# Patient Record
Sex: Male | Born: 1960 | Hispanic: No | Marital: Married | Smoking: Former smoker
Health system: Southern US, Community
[De-identification: ages and names within clinical notes are randomized; demographics above are authoritative.]

## PROBLEM LIST (undated history)

## (undated) DIAGNOSIS — E785 Hyperlipidemia, unspecified: Secondary | ICD-10-CM

## (undated) DIAGNOSIS — F329 Major depressive disorder, single episode, unspecified: Secondary | ICD-10-CM

## (undated) DIAGNOSIS — I10 Essential (primary) hypertension: Secondary | ICD-10-CM

## (undated) DIAGNOSIS — F32A Depression, unspecified: Secondary | ICD-10-CM

## (undated) HISTORY — DX: Hyperlipidemia, unspecified: E78.5

## (undated) HISTORY — DX: Essential (primary) hypertension: I10

## (undated) HISTORY — PX: CORONARY ARTERY BYPASS GRAFT: SHX141

## (undated) HISTORY — PX: SPINE SURGERY: SHX786

## (undated) HISTORY — DX: Major depressive disorder, single episode, unspecified: F32.9

## (undated) HISTORY — PX: TONSILECTOMY, ADENOIDECTOMY, BILATERAL MYRINGOTOMY AND TUBES: SHX2538

## (undated) HISTORY — DX: Depression, unspecified: F32.A

---

## 2007-08-31 ENCOUNTER — Encounter: Admission: RE | Admit: 2007-08-31 | Discharge: 2007-10-10 | Payer: Self-pay | Admitting: Anesthesiology

## 2007-09-05 ENCOUNTER — Ambulatory Visit: Payer: Self-pay | Admitting: Anesthesiology

## 2007-10-04 ENCOUNTER — Encounter
Admission: RE | Admit: 2007-10-04 | Discharge: 2007-10-05 | Payer: Self-pay | Admitting: Physical Medicine & Rehabilitation

## 2007-10-05 ENCOUNTER — Ambulatory Visit: Payer: Self-pay | Admitting: Physical Medicine & Rehabilitation

## 2007-10-10 ENCOUNTER — Ambulatory Visit: Payer: Self-pay | Admitting: Anesthesiology

## 2007-11-30 ENCOUNTER — Encounter: Admission: RE | Admit: 2007-11-30 | Discharge: 2008-02-28 | Payer: Self-pay | Admitting: Anesthesiology

## 2007-12-05 ENCOUNTER — Ambulatory Visit: Payer: Self-pay | Admitting: Anesthesiology

## 2008-01-16 ENCOUNTER — Ambulatory Visit: Payer: Self-pay | Admitting: Anesthesiology

## 2008-03-11 ENCOUNTER — Encounter: Admission: RE | Admit: 2008-03-11 | Discharge: 2008-06-09 | Payer: Self-pay | Admitting: Anesthesiology

## 2008-03-12 ENCOUNTER — Ambulatory Visit: Payer: Self-pay | Admitting: Anesthesiology

## 2008-04-09 ENCOUNTER — Ambulatory Visit: Payer: Self-pay | Admitting: Anesthesiology

## 2008-06-04 ENCOUNTER — Encounter: Admission: RE | Admit: 2008-06-04 | Discharge: 2008-07-31 | Payer: Self-pay | Admitting: Anesthesiology

## 2008-06-04 ENCOUNTER — Ambulatory Visit: Payer: Self-pay | Admitting: Anesthesiology

## 2008-07-31 ENCOUNTER — Encounter
Admission: RE | Admit: 2008-07-31 | Discharge: 2008-10-29 | Payer: Self-pay | Admitting: Physical Medicine and Rehabilitation

## 2008-08-02 ENCOUNTER — Ambulatory Visit: Payer: Self-pay | Admitting: Physical Medicine and Rehabilitation

## 2008-10-09 ENCOUNTER — Ambulatory Visit: Payer: Self-pay | Admitting: Physical Medicine and Rehabilitation

## 2008-11-06 ENCOUNTER — Encounter
Admission: RE | Admit: 2008-11-06 | Discharge: 2008-11-08 | Payer: Self-pay | Admitting: Physical Medicine and Rehabilitation

## 2008-11-08 ENCOUNTER — Ambulatory Visit: Payer: Self-pay | Admitting: Physical Medicine and Rehabilitation

## 2009-03-20 ENCOUNTER — Encounter
Admission: RE | Admit: 2009-03-20 | Discharge: 2009-06-16 | Payer: Self-pay | Admitting: Physical Medicine and Rehabilitation

## 2009-03-21 ENCOUNTER — Ambulatory Visit: Payer: Self-pay | Admitting: Physical Medicine and Rehabilitation

## 2009-04-18 ENCOUNTER — Ambulatory Visit: Payer: Self-pay | Admitting: Physical Medicine and Rehabilitation

## 2009-04-18 ENCOUNTER — Encounter
Admission: RE | Admit: 2009-04-18 | Discharge: 2009-05-22 | Payer: Self-pay | Admitting: Physical Medicine & Rehabilitation

## 2009-04-28 ENCOUNTER — Ambulatory Visit: Payer: Self-pay | Admitting: Physical Medicine & Rehabilitation

## 2009-05-05 ENCOUNTER — Ambulatory Visit: Payer: Self-pay | Admitting: Physical Medicine & Rehabilitation

## 2009-05-12 ENCOUNTER — Ambulatory Visit: Payer: Self-pay | Admitting: Physical Medicine & Rehabilitation

## 2009-05-22 ENCOUNTER — Ambulatory Visit: Payer: Self-pay | Admitting: Physical Medicine & Rehabilitation

## 2009-06-16 ENCOUNTER — Encounter
Admission: RE | Admit: 2009-06-16 | Discharge: 2009-09-10 | Payer: Self-pay | Admitting: Physical Medicine and Rehabilitation

## 2009-07-21 ENCOUNTER — Ambulatory Visit: Payer: Self-pay | Admitting: Physical Medicine and Rehabilitation

## 2009-08-20 ENCOUNTER — Ambulatory Visit: Payer: Self-pay | Admitting: Physical Medicine and Rehabilitation

## 2009-09-10 ENCOUNTER — Encounter
Admission: RE | Admit: 2009-09-10 | Discharge: 2009-12-09 | Payer: Self-pay | Admitting: Physical Medicine and Rehabilitation

## 2009-09-17 ENCOUNTER — Ambulatory Visit: Payer: Self-pay | Admitting: Physical Medicine and Rehabilitation

## 2009-10-03 ENCOUNTER — Ambulatory Visit: Payer: Self-pay | Admitting: Physical Medicine and Rehabilitation

## 2009-11-12 ENCOUNTER — Ambulatory Visit: Payer: Self-pay | Admitting: Physical Medicine and Rehabilitation

## 2009-12-18 ENCOUNTER — Encounter
Admission: RE | Admit: 2009-12-18 | Discharge: 2010-03-18 | Payer: Self-pay | Source: Home / Self Care | Attending: Physical Medicine and Rehabilitation | Admitting: Physical Medicine and Rehabilitation

## 2009-12-24 ENCOUNTER — Ambulatory Visit: Payer: Self-pay | Admitting: Physical Medicine and Rehabilitation

## 2010-01-26 ENCOUNTER — Ambulatory Visit: Payer: Self-pay | Admitting: Physical Medicine and Rehabilitation

## 2010-02-24 ENCOUNTER — Ambulatory Visit: Payer: Self-pay | Admitting: Physical Medicine and Rehabilitation

## 2010-03-30 ENCOUNTER — Encounter
Admission: RE | Admit: 2010-03-30 | Discharge: 2010-04-14 | Payer: Self-pay | Source: Home / Self Care | Attending: Physical Medicine and Rehabilitation | Admitting: Physical Medicine and Rehabilitation

## 2010-04-07 ENCOUNTER — Ambulatory Visit: Admit: 2010-04-07 | Payer: Self-pay | Admitting: Physical Medicine and Rehabilitation

## 2010-04-27 ENCOUNTER — Ambulatory Visit: Payer: Worker's Compensation | Admitting: Physical Medicine and Rehabilitation

## 2010-04-27 ENCOUNTER — Encounter: Payer: Worker's Compensation | Attending: Physical Medicine and Rehabilitation

## 2010-04-27 DIAGNOSIS — M24559 Contracture, unspecified hip: Secondary | ICD-10-CM | POA: Insufficient documentation

## 2010-04-27 DIAGNOSIS — G894 Chronic pain syndrome: Secondary | ICD-10-CM

## 2010-04-27 DIAGNOSIS — Z9689 Presence of other specified functional implants: Secondary | ICD-10-CM | POA: Insufficient documentation

## 2010-04-27 DIAGNOSIS — M48061 Spinal stenosis, lumbar region without neurogenic claudication: Secondary | ICD-10-CM

## 2010-04-27 DIAGNOSIS — G589 Mononeuropathy, unspecified: Secondary | ICD-10-CM | POA: Insufficient documentation

## 2010-04-27 DIAGNOSIS — M545 Low back pain, unspecified: Secondary | ICD-10-CM | POA: Insufficient documentation

## 2010-04-27 DIAGNOSIS — G8929 Other chronic pain: Secondary | ICD-10-CM | POA: Insufficient documentation

## 2010-04-27 DIAGNOSIS — M79609 Pain in unspecified limb: Secondary | ICD-10-CM

## 2010-04-27 DIAGNOSIS — I69992 Facial weakness following unspecified cerebrovascular disease: Secondary | ICD-10-CM | POA: Insufficient documentation

## 2010-06-15 ENCOUNTER — Encounter
Payer: Worker's Compensation | Attending: Physical Medicine and Rehabilitation | Admitting: Physical Medicine and Rehabilitation

## 2010-06-15 DIAGNOSIS — M545 Low back pain, unspecified: Secondary | ICD-10-CM | POA: Insufficient documentation

## 2010-06-15 DIAGNOSIS — I739 Peripheral vascular disease, unspecified: Secondary | ICD-10-CM | POA: Insufficient documentation

## 2010-06-15 DIAGNOSIS — G894 Chronic pain syndrome: Secondary | ICD-10-CM

## 2010-06-15 DIAGNOSIS — M79609 Pain in unspecified limb: Secondary | ICD-10-CM

## 2010-06-15 DIAGNOSIS — I69992 Facial weakness following unspecified cerebrovascular disease: Secondary | ICD-10-CM | POA: Insufficient documentation

## 2010-06-15 DIAGNOSIS — M62838 Other muscle spasm: Secondary | ICD-10-CM | POA: Insufficient documentation

## 2010-06-15 DIAGNOSIS — R29898 Other symptoms and signs involving the musculoskeletal system: Secondary | ICD-10-CM | POA: Insufficient documentation

## 2010-06-15 DIAGNOSIS — Z79899 Other long term (current) drug therapy: Secondary | ICD-10-CM | POA: Insufficient documentation

## 2010-07-27 ENCOUNTER — Encounter
Payer: Worker's Compensation | Attending: Physical Medicine and Rehabilitation | Admitting: Physical Medicine and Rehabilitation

## 2010-07-27 DIAGNOSIS — M545 Low back pain, unspecified: Secondary | ICD-10-CM | POA: Insufficient documentation

## 2010-07-27 DIAGNOSIS — G8929 Other chronic pain: Secondary | ICD-10-CM | POA: Insufficient documentation

## 2010-07-27 DIAGNOSIS — Z79899 Other long term (current) drug therapy: Secondary | ICD-10-CM | POA: Insufficient documentation

## 2010-07-27 DIAGNOSIS — M24559 Contracture, unspecified hip: Secondary | ICD-10-CM | POA: Insufficient documentation

## 2010-07-27 DIAGNOSIS — Z9889 Other specified postprocedural states: Secondary | ICD-10-CM | POA: Insufficient documentation

## 2010-07-27 DIAGNOSIS — G894 Chronic pain syndrome: Secondary | ICD-10-CM

## 2010-07-27 DIAGNOSIS — Z9689 Presence of other specified functional implants: Secondary | ICD-10-CM | POA: Insufficient documentation

## 2010-07-28 NOTE — Assessment & Plan Note (Signed)
[name] is a pleasant 50 year old gentleman, who is father of 3  small children.  He was last seen by me on Wednesday October 09, 2008.  He  is being followed in our [facility] for chronic low  back pain and left foot burning when he walks.   He is known to have a worker's comp injury dating back to March 2006,  when he fell off a dot and hurt his back.  He underwent an L5 through S1  DePuy total disk by Dr. Gaye Pollack in [facility] June 2008.  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   At the last visit, I wrote a prescription for some Neurontin to start.  Apparently, he just got approval and began the medication a few days  ago.  He is only taking 2 doses and is going to be titrated up to a  total of 4 doses per day.   He states his average pain in the low back is about an 8-9 on a scale of  10.  He is able to walk about 10 minutes at a time before he gets quite  a bit of left leg and anterior leg and dorsal foot pain.  Pain improves  when he is sitting.  He can climb stairs.  He is able to drive.  He is  independent with self-care.  Denies problems controlling bowel or  bladder.  He denies weakness.  He admits to some depression, but denies  suicidal ideation.  No other changes in past medical, social, or family  history since last visit.   On exam, his blood pressure is elevated 158/101.  He is out of his blood  pressure medicines for 2 days and anticipate he will get them filled  today, pulse is 77, respirations 18, 98% saturated on room air.  He is a  well-developed, well-nourished gentleman, who does not appear in any  distress.  He is oriented x3.  Speech is clear.  His affect is bright.  He is alert, cooperative, and pleasant.  Follows commands without  difficulty.  Answers my questions appropriately.  Coordination is  intact.  Cranial nerves are  intact.  Reflexes are 2+ at patellar and  Achilles tendons symmetric.  No abnormal tone or clonus or tremors are  noted.  Manual muscle testing reveals 5/5 strength in the lower  extremities without focal deficit including EHL.   Gait is assessed.  He transitions easily from sitting to standing.  Gait  displays.  He is not antalgic and stable.  Tandem gait and Romberg tests  are performed adequately.   Medical problems include recent myocardial infarction, status post  cardiac stent November 2009, hypercholesterolemia, hypertension.   PLAN:  Pickup blood pressure medication and start taking medication  again.  Call primary care doctor if blood pressure continues to stay  high.   I have also recommend he continue to titrate up on his Neurontin to 4  times a day as indicated last month.  His Vicodin was called in for him  and he fills it on October 28, 2008.  He has 76 tablets left.   We will follow him back up in 6 weeks.  Anticipate increasing Neurontin  dosage if he is tolerating it well and still having some problems with  his left lower extremity with walking.  We will refill his Vicodin  for  him when he called in, in about 3 weeks.  Derek Sherman takes his  medications as prescribed.  He does not exhibit any aberrant behavior  and he reports fair relief with current medications.           ______________________________  Brantley Stage, M.D.    DMK/MedQ  D:  11/08/2008 10:42:38  T:  11/09/2008 00:45:36  Job #:  [id number]

## 2010-07-28 NOTE — Assessment & Plan Note (Signed)
[facility].  I evaluated  him and reviewed the health and history form, 14-point review of  systems, and with his permission taken case manager to the room.   Derek Sherman is a veteran has been seen by Dr. Wynn Banker.  I talked him over  with Dr. Wynn Banker, and I think we are pretty much on the same page  where a medial branch block is warranted.  This would help [place] understand  his spondylitic pain, and RF could be an option.  Added biomechanical  stress above and below surgical fixation site.  1. I will go ahead and proceed with agent changes.  He is breaking      through Morphine and hydrocodone, and the cautions are given with      this medications.  At some point, we should get a testosterone      level.  He must maintain contact with primary care, and I cautioned      him as to excessive acetaminophen.  He understands to use this      medicine appropriately within the contracts of patient care      agreement, and he also understands that the rationale to perform      intervention is to minimize escalation to control substances and      eventually move away.  We want to get him back to some kind of      work.  He is a young vital member of the society and [employer]ocational      [employer]raining wants to work with him.   Objectively, he has diffuse paralumbar __________ positive side-bending.  Range of motion impaired secondary to pain with extension.  Not so much  arm pain.  He has some foot pain, this is more of a spondylitic pain.  Modified Gaenslen and Luisa Hart tests equivocal.   IMPRESSION:  Spondylosis, degenerative spine disease lumbar spine and  elements of postlaminectomy syndrome.   PLAN:  1. Options available included adhesiolysis, facet medial branch      intervention with possible RF, and eventually may be even possible      dorsal column stimulation.  I do not endorse dorsal column      stimulation at this time as this is minimal leg pain,  more of a      back pain problem.  2. Follow up with him.  He will go ahead and assess this facet block.  3. We will switch him to Roxicodone with cautions given.  He has taken      oxycodone in the past.  4. Home based therapy.  5. Maintain contact with primary care.  He will also stop the NSAID as      he has had a stroke in the past.  I just do not want to increase      that risk, elevated blood pressure, and the like.  I have reviewed      this plan with him.  We will follow up with him in 1 month with      precautions given.  We are working on pain control.           ______________________________  Celene Kras, MD     HH/MedQ  D:  10/10/2007 13:16:19  T:  10/11/2007 03:25:44  Job #:  [id number]

## 2010-07-28 NOTE — Assessment & Plan Note (Signed)
Derek [facility] as a new  patient, evaluation and management, through the [facility] system,  examined and with his permission case manager to the room.  He has an  individual who was involved in a loading dock accident where he fell  injuring his low back and ankle.  He has been followed by [facility], referred here for pain control management.  He has had  fusion, disc replacement L5-S1 June 2008, with long-standing and  persistent pain as the result of this injury, and subsequent surgery,  but the radicular symptoms have improved.  He is describing both  spondylitic pain, and osteoarthritic position with his ankle, examined  historical features also suggest mechanical pain to the posterior  elements of the spine that being SI and facet.  An 8/10 on the  subjective scale, made worse by walking, bending, sitting, he has  trouble awaking at night, improved with some medications but breaks  through with hydrocodone frequently.  He is unable to work, he has 3  kids at home that needs to help take care of.  He feels he is disabled.   MEDICATIONS:  Reviewed and attached to chart.  Cautions as to Naprelan,  with known cardiovascular disease.   Health and history form reviewed.  PMH remarkable for stents in November  2006.   FAMILY HISTORY:  Heart disease, hypertension.   He is married.  He uses alcohol and I cautioned.  Smoker, he is trying  to quit.   PAST MEDICAL HISTORY:  1. Heart disease.  2. CVA, no residual.  3. Hypertension, followed by primary care.   Review of systems, family and social history are otherwise  noncontributory.   PHYSICAL EXAMINATION:  GENERAL:  Pleasant male, sitting comfortably in  bed.  Affect (normal) oriented x3.  HEENT:  Otherwise unremarkable.  CHEST:  Clear to auscultation and percussion.  HEART:  Regular rate and rhythm without rub, murmur, or gallop.  ABDOMEN:  Slightly obese, benign, no  hepatosplenomegaly.  EXTREMITIES:  He has diffuse paralumbar myofascitis, Fortin test  positive.  Waddell positive 2/5, some distractibility, ankle pain with  inversion and eversion, no evidence of pseudomotor changes, good  capillary filling.   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   PLAN:  1. At some point, we may address the spondylitic issue, and the      posterior elements by blocking the SI and facet.  He has had this      done and had marginal response, but I would like to at least      address the medial branch because RF could be an option for [place].  2. Other modifiable features and health profile such as cigarette      cessation is mandatory for best outcome.  3. He is to maintain contact with primary care, and he is trying to      loose weight and become more active, and to try to quit smoking.      He states he is down to about 4 cigarettes a day.  4. I am going to switch him to a pharmacokinetically long-acting agent      that being morphine, and discussed with him.  I have reviewed this      with him.  He will stop the Naprelan and I will add Neurontin      nightly.  5. We will go ahead and follow up with  him to determine further course      of care.  I recommend aquatic therapy at      some point, advanced endurance activities, questions were answered,      discussed in lay terms, full informed consent, UDS.  He understands      the patient care agreement.  No opioid consent.           ______________________________  Celene Kras, MD     HH/MedQ  D:  09/05/2007 14:49:29  T:  09/07/2007 03:29:48  Job #:  [id number]

## 2010-07-28 NOTE — Assessment & Plan Note (Signed)
Derek Sherman comes for pain management today.  He was accompanied with  his case Production designer, theatre/television/film.  With his permission, she is in the room.  1. He did not have significant release cycling or benchmarks were not      reached with the facet medial branch intervention sequentially.  So      I am going to hold off any further blocks.  2. He had an MI in November.  3. Uncomplicated but stents were placed.  4. He is on Plavix, this will preclude any interventions for the next      4 months.  5. We will manage him medically.  I am going to have him on Percocet      for the next month or 2, mindful of its potential habituation and      risks of this medication reviewed with him.  Follow him expectantly.  He has quit smoking.  He is getting in cardiac  rehab next month.   Objectively, diffuse __________ positive side bending, alert and  engaging, he is stable and actually his color is good.  I do not see  anything new neurologically.   IMPRESSION:  Degenerative spinal disease lumbar spine with  cardiovascular disease status post non-complicated MI.   PLAN:  1. Apparently, he did not have any muscle damage.  He did have some      vascular damage and he seemed to convalesce nicely from his MI.  2. We will follow him conservatively particularly over the next month      or 2.  3. Predicate any further interventions based on need, but we will try      to move toward MMI, possibly return to work with some capacity, and      this will be involving situation for [place].  We will continue      reassessment.  Discharge instructions given.  No barrier to      communication.           ______________________________  Celene Kras, MD     HH/MedQ  D:  03/12/2008 14:20:55  T:  03/13/2008 05:46:15  Job #:  [id number]

## 2010-07-28 NOTE — Assessment & Plan Note (Signed)
[name] is a 50 year old, married gentleman, who is the father  of 3 small children.  He was seen for the first time by me on Aug 02, 2008.  He missed his appointment last month and is then rescheduled for  today.   In the interim, since he saw me, he has seen Dr. Lorelle Sherman in [place]  for an independent medical evaluation.  Apparently, Dr. Lorelle Sherman  according to Derek Sherman has recommended that he obtain a CT scan with  myelogram of his lumbar spine.   Derek Sherman is back in today for a refill of his medications.   He is known to have undergone an L5-S1 disk replacement, June 2008.  He  currently has complaints of low back pain and limited walking ability  not more than 10 minutes at a time before his left foot starts burning  and and his back starts to become more painful.   His average pain is about 8 on a scale of 10.  Pain is worse with  walking, standing, sometimes sitting, rest and medications seem to help.  He is not sure that the oxycodone and Percocet are helping much.  He is  requesting switch back to maybe some Vicodin.  He is not interested in  morphine at this time.   He is independent with self-care including feeding, dressing, bathing,  and toileting.   REVIEW OF SYSTEMS:  Otherwise, negative.  Denies any problems  controlling bowel or bladder.  Denies new weakness.  Denies depression,  anxiety, or suicidal ideation.   No other changes in past medical, social, or family history, since our  last visit, although he states he forgot to take his blood pressure  medications this morning because he was in a hurry.   On exam, his blood pressure is elevated initially 191/104, repeating  with appropriate size cuff 155/99, pulse 80, respirations 18, 92%  saturated.  The patient has no current symptoms of chest pain, dyspnea,  diaphoresis.   He is a well-developed, well-nourished gentleman, who does not appear in  any distress.  He is oriented x3.  Speech is  clear.  His affect is  bright.  He is alert, cooperative, and pleasant.  Follows commands  without difficulty and answers all my questions appropriately.  Cranial  nerves and coordination are intact.  His reflexes are 2+ at the patellar  and Achilles tendons.  No abnormal tone is noted.  No clonus is noted.  No tremors are appreciated.  He has intact sensation in both lower  extremities.  Straight leg raise is negative.   Transitioning from sitting to standing is done without significant  difficulty.  His gait is non antalgic.  He walks with flexed hips and  with a flattened lumbar spine.  He has difficulty extending at all and  he has minimal lumbar flexion.   He has some minimal tenderness in the lower lumbar spinal segment and  into the paraspinal musculature as well.   IMPRESSION:  1. Status post work injury, March 2006 fell off dock in his back.  2. Underwent L5-S1 DePuy total disk by Dr. Vikki Sherman,      June 2008.  3. History of neurogenic claudication symptoms for at least a year and      half now.  4. Chronic pain syndrome.   Medical problems include recent myocardial infarction, status post  cardiac stent, November 2009, hypercholesterolemia, and hypertension.   PLAN:  I asked Derek Sherman to make sure  he takes his hypertension meds  when he gets home since he forgot to do that this morning before he  left, he states he will comply.  We discussed treatment options to help  manage what appears to be some neurogenic claudication symptoms.  He has  been having for a while.  To that end, we will restart him back on some  gabapentin starting at 300 at night, titrating him over the next couple  of weeks to 4 times a day.  We will give him a refill of medications on  that.  At this time, he has 49 Percocet tablets left.  We will not give  him another prescription for narcotics today.  He takes 1 tablet about 3  times a day, when he calls back in, he would like to be  switched back to  a hydrocodone preparation.  Anticipate when he calls back, we will put  him on Vicodin 10/500 t.i.d. #90.   Derek Sherman takes his pain medications as prescribed.  I have not observed  aberrant behavior with him, and he reports some relief with the pain  medications.  We will see him back in 2 months', nursing visit next  month for a refill of his medications.  Await further imaging studies of  his lumbar spine to evaluate for evidence of stenosis.           ______________________________  Derek Sherman, M.D.     DMK/MedQ  D:  10/09/2008 13:16:35  T:  10/10/2008 03:28:53  Job #:  [id number]

## 2010-07-28 NOTE — Assessment & Plan Note (Signed)
[facility].  I evaluated  him via health and history form 14-point review of systems.  1. Derek Sherman comes to [place] today to a stable position, note nothing really      changed in the interval.  He is still in the cardiac rehab phase,      and finding that he is taking 2-3 Percocet a day with relief      cycling, but breaking through from time to time.  I cautioned as to      following directions, sometimes he states he has to take 2 in the      morning.  To obviate this, I think during our rehab phase in his      cardiac position, it is reasonable to give him Roxicodone 15 mg      strength, but we can avoid the acetaminophen, get him little bit      more relief cycling and he is understanding that there were going      to transition away from this drug over the next month or two.  I      will see him in 1 month to determine further course of care.  2. Other modifiable features in health profile discussed.  He is quit      smoking that should be commended.  He is doing what he can in rehab      phase, nothing new neurologically.  Nothing from an advancing      pseudomotor change perspective, and we can follow expectantly.   Objectively, I examined his ankle which has really no specific change,  otherwise, nothing new neurologically.  Good vascular integrity.   His back is mostly myofascial mechanical, nothing new neurologically.   IMPRESSION:  Degenerative spine disease lumbar spine, peripheral  neuropathy unspecified.   PLAN:  Conservative management.           ______________________________  Celene Kras, MD     HH/MedQ  D:  06/04/2008 11:27:39  T:  06/05/2008 00:27:15  Job #:  [id number]

## 2010-07-28 NOTE — Assessment & Plan Note (Signed)
Derek Sherman is a pleasant 50 year old gentleman who is [facility].  He has a worker's compensation injury and is status post an L5-S1 DePuy total disk replacement which was complicated by chronic low back pain and neurogenic claudication.  His surgery for this total disk replacement was done by Dr. Lorie Phenix in June 2008.  He has residual neurogenic claudication and bilateral hip flexion contractures as well.  His average pain is about 8 on a scale of 10.  His pain is constant, it is currently located in the low back and somewhat into the left foot. Pain is worse with walking, prolonged sitting or standing and improves with medications.  He gets fairly good relief with current meds.  Medications prescribed through center for pain include: 1. Vicodin 10/500 four times a day. 2. Gabapentin 300 mg one p.o. q.8 a.m., noon 3:00 p.m., 7:00 p.m., two     at h.s. and two at 3:00 a.m.  Derek Sherman is back in today for refill of his pain medication.  He has recently undergone some cervical spine surgery with Dr. Orlie Pollen on June 25, 2010.  I do not have any notes regarding the surgery from Dr. Orlie Pollen.  He had a followup appointment with this his surgeon on Jul 24, 2010, and he has another followup appointment in 3 months.  No other changes were noted in past medical, social, or family history. I asked him to maintain contact with primary care with his primary care doctor as well.  On exam today, his blood pressure is 143/94, pulse 70, respirations 18, 95% saturation on room air.  He is a well-developed, well-nourished mildly obese gentleman.  He is oriented x3.  Speech is clear.  Affect is bright.  He is alert, cooperative, and pleasant.  Follows commands without difficulty, answers my questions appropriately.  Cranial nerves and coordination are grossly intact.  His reflexes are diminished in the upper and lower extremities.  No abnormal  tone, clonus, or tremors are noted today.  His motor strength is good in both lower extremities.  He is able to transition from sitting to standing without difficulty.  Gait is not antalgic.  Tandem gait and Romberg tests are performed relatively well.  No focal deficits are noted with respect to strength in the lower extremities.  IMPRESSION: 1. Recent cervical spine surgery per Dr. Orlie Pollen, June 25, 2010. 2. Status post L5-S1 DePuy total disk replacement per Dr. Lorie Phenix in     June 2008. 3. History of chronic low back pain. 4. Neurogenic claudication. 5. Bilateral hip flexion contracture.  PLAN:  We will refill his hydrocodone 10/325 one p.o. q.i.d. p.r.n. x120.  He does not need a refill on his gabapentin today.  He did not bring in his prescription bottles today.  I have asked him to make sure he does this next visit.  He states he will comply.  We will try to get some notes from his previous surgeon regarding his neck surgery.  I have answered all his questions, he is comfortable with our plan.  He will be following up with our nurse practitioner over the next 2 months and I will see him back in 3 months. We will continue to monitor his pill counts and random urine drug screens.     Brantley Stage, M.D.    DMK/MedQ D:  07/27/2010 12:36:58  T:  07/28/2010 00:05:04  Job #:  [id number]

## 2010-07-28 NOTE — Group Therapy Note (Signed)
CONSULT REQUESTED BY:  Derek Kras, MD   Derek Sherman is a 50 year old male who had a work-related injury, June 18, 2005.  He has reported constant low back and left lower extremity pain  since that time.  He has had physical therapy as well as epidural  steroid injections but he eventually underwent a lumbar disk replacement  at L5-S1 performed by Dr. Lorie Sherman, August 25, 2006.  Prior to the disk  replacement surgery, he had a normal diskogram performed by Dr.  Lorie Sherman.  He also had IMEs performed both by Dr. Alveda Sherman and Dr. Shelle Sherman  here in [place] prior to the proposed surgery.   Postoperatively, he had continued pain in his back and lower extremity.  Lodine has helped somewhat.  Elavil has helped with right lower  extremity pain, but not with left lower extremity pain.  He had some  type of injections postoperatively, but I do not have the records in  regards to this.  He has had neurology reevaluation postoperatively.  He  has had EMG demonstrating a right L4-L5 radiculopathy.   He has also been seen by pain management, Dr. Celene Sherman who started  him on MS Contin 15 mg b.i.d.  He had an urine drug screen with [employer]  performed at the time of Dr. Kerry Sherman evaluation, September 07, 2007.  Urine  drug screen did not show any inconsistencies with reported medications.   The patient does have an appointment with Dr. Stevphen Sherman next week again.   His pain is described as constant, burning, average 8/10, currently 7,  interfering with activity at 8/10 level.  Relief from meds is overall  fair.  He thinks he did better with an oxycodone than with morphine.  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   He did see Dr. Venita Sherman from Spine [facility], [facility].  He was given a 20% disability rating and a 15-pound sedentary lifting  restriction.   His other past  medical history is significant for coronary artery  disease, status post stenting in November 2006, had preoperative  clearance from cardiology to undergo surgery.   REVIEW OF SYSTEMS:  Positive for numbness and tingling, left lower  extremity, trouble walking, and depression.   SOCIAL HISTORY:  Married.  Smokes 2 cigarettes a day.   FAMILY HISTORY:  Heart disease, diabetes, and high blood pressure.   CURRENT MEDICATIONS:  1. Neurontin 300 mg at bedtime.  2. MS Contin 15 mg b.i.d.   PHYSICAL EXAMINATION:  Blood pressure is 162/96.  He states that he has  had treatment for his blood pressure, but it goes up and down depending  on his pain.   He is a well-developed and well-nourished male; moderately overweight.  Orientation x3.  Affect is anxious.  Gait is with a limp.  He has  forward flex at the hips when he first gets up and then straightens up.  His lumbar spine range of motion is 50% with forward flexion and 0-25%  in extension.  Extension is more painful than flexion and extension  hurts at the lumbosacral junction.  He has pain leaning toward left and  right sides as well as twisting and only has 50% range in these  directions.   His motor strength is 5/5, bilateral deltoid, biceps, triceps, grip, as  well as hip flexion,  knee extension, and ankle dorsiflexion.  His  sensation is normal in bilateral upper extremities, but decreased left  L3 dermatome.  This is specifically over the kneecap area.   Deep tendon reflexes are normal in the bilateral upper and lower  extremities.  Neck range motion is normal.  Coordination is normal.  Extremities without edema in the upper extremities and lower  extremities.  His hip, knee, and ankle range of motion is all normal.  Upper extremity range of motion is normal as well.   IMPRESSION:  1. Chronic postoperative pain following lumbar disk replacement and      history of lumbar diskogenic problem by history, however, normal       diskogram.  This suggests other potential pain generators.  For      this reason, I would recommend lumbar medial branch block to see if      facet mediated pain may be at least part of the issue here.  I      would expect that he also has some myofascial pain.  2. Right lower extremity radiculopathy, chronic.  Overall improved      with amitriptyline.  3. Left lower extremity pain.  This started quite sometime after his      initial injury and surgery, I am not sure whether it is actually      related.  4. Poor activity and functional status, 10-minute walking tolerance,      not working for 2 years.  He states that he does not even play with      his kids because of pain.   I do think he does have a chronic pain syndrome and would benefit from a  multidisciplinary pain program and we will make a referral over to [facility] in [facility].   Thank you for this interesting consultation.  In terms of medications, I  have stopped the MS Contin and given him 1-week supply of oxycodone  5/325 mg t.i.d. and also increased his Neurontin 300 mg b.i.d.  I would  consider other options such as Flector patch as well.   I will see him back in a month or two.  I will be happy to perform any  injections should scheduling become an issue, but at this point, we will  send him back to Dr. Stevphen Sherman in terms of this.   Overall, I do agree with the 20% disability rating 15 pounds sedentary  restriction, statistically unlikely to get back to work,but  functional  restoration that would be best served by completing a multidisciplinary  pain program, which would end with FCE, which can give some more  specific restrictions and help with case closure.  Depending on medial  branch blocks, he may benefit from radiofrequency neurotomy as well.      Derek Sherman, M.D.  Electronically Signed     AEK/MedQ  D:  10/05/2007 17:04:12  T:  10/06/2007 00:13:10  Job #:  [id number]   cc:   Derek Kras,  MD  Fax: [phone]   Derek Sherman  [phone]

## 2010-07-28 NOTE — Assessment & Plan Note (Signed)
[name] is a 50 year old married gentleman, father of 3 small  children.  He has been seen for approximately 1 year now by Dr. Celene Kras.   [name] has a history of a work injury dating back to May 18, 2004,  when he fell off a doc injuring his back.  He eventually underwent an L5-  S1 disk replacement with a DePuy Charite disk replacement that was June  2008.   [name] states that he had some relief for about a month and then he  developed significant left thigh leg symptoms, leg pain especially when  he stands.   He has been on doing conservative management now since that time to help  manage his pain.   He states that 80% of his pain is in the back.  He has difficulty  standing straight.  If he walks more than 10 minutes, he gets pain down  the left leg which makes him half to sit down.   Over the last year, he has undergone several medial branch blocks which  has not helped.  He has also had epidural steroid injections as well as  facet injections which have not helped.   He has been on many medications over the last year including MS Contin,  Percocet, Neurontin, oxycodone, Atarax, Pamelor Darvocet, Lyrica.   He has also trialed the TENS unit.  He uses a back support.   He states he sleeps very little and he cannot sleep flat, sleep is  curled up.   Average pain is about 7 or 8 in a scale of 10.  Pain is described as  constant aching, worse in the morning when he first wakes up, but does  limit him throughout the day.   Pain is worse with walking and sitting, improves with rest.   MEDICATIONS:  He has fair relief with current meds.   Medications which have been provided by this clinic include Percocet  10/650 not more than 3 tablets per day and Atarax 10 mg one p.o. q.h.s.  per Dr. Stevphen Rochester over the last couple of months.   FUNCTIONAL STATUS:  The patient is able to walk about 10 minutes.  He is  able to climb stairs.  He does drive.  He is independent  with self-care.  He does help out at home with cooking light meals and helping out with  laundry on occasion.  He has last worked back in 2006.   Denies problems controlling bowel or bladder.  Denies anxiety.  Admits  to some depression, but denies suicidal ideations.  Denies persistent  weakness or numbness.   Review of systems is essentially negative today.   Past medical history is remarkable for recent MI in November 2009.  He  had stents placed at that time and went to cardiac rehab as well.  His  cardiologist is Dr. Vilinda Sherman.  His primary care doctor is Derek Sherman.  He also manages his hypertension and hypercholesterolemia.  He is also  now on Plavix.   SOCIAL HISTORY:  The patient is married, lives with his wife with 60, 52,  and 81-year-old children.  Denies illegal substance use.  Denies alcohol  use, quit smoking.   FAMILY HISTORY:  Positive.  Mother died at age 51 with heart problems  among other things.  Father died at age 26 with heart problems.   Exam today, blood pressure is 120/82, pulse 72, respirations 16, 96%  saturated on room air.  He is  a well-developed, mildly obese gentleman,  who does not appear in any distress, although he does seem somewhat  uncomfortable as he is sitting talking to me in the interview.   He is oriented x3.  Speech is clear.  Affect is bright.  He is alert,  cooperative, and pleasant.  Follows commands without difficulty. Answers  questions appropriately.   Cranial nerves are grossly intact.  Coordination is intact.  Reflexes  are slightly brisk in both upper and lower extremities.  However, there  is no clonus.  No abnormal tone or tremors are appreciated.   He has intact sensation in the lower extremities to light touch.  Pinprick is diminished along the lateral left ankle.  Vibratory sense is  intact as well.   Motor strength is 5/5 in the lower extremities without obvious focal  deficit including EHL is 5/5 bilaterally.   Straight leg raise is  negative.   He is able to transition easily from sitting to standing.  Gait in the  room is antalgic.  He walks with a flattened slightly kyphotic lumbar  spine, decreased weightbearing to the left lower extremity.   Tandem gait is performed adequately.  Romberg test is performed  adequately.   Rotation in the cervical spine is not painful.  However, when he extends  his neck, he states that he feels some discomfort in the low lumbar  region.   Minimal tenderness to palpation in the lumbar paraspinal musculature  some discomfort at the lower lumbar segments with palpation.   He has limitations in lumbar motion in all planes.  He is able to flex  forward minimally.  He is unable to extend at all.  He is unable to  laterally flex to the left.  He can minimally flex to the right.   IMPRESSION:  1. Status post work injury, March 2006 fell off doc and hurt back.  2. Underwent L5-S1 DePuy Charite disk replacement, Dr. Lorie Sherman,      [facility] June 2008.  3. 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  4. Chronic pain syndrome.   Medical problems include recent MI, status post cardiac stent November  2009, hypercholesterolemia, and hypertension.   PLAN:  [name] over the last year has been through conservative pain  management program with Dr. Stevphen Rochester.  He has been on multiple pain  medications as delineated earlier in this note.   He has trialed TENS unit as well as lumbar support.  He is interested in  following back up with Dr. Shon Baton for further evaluation at this time to  determine what his options are as far as more aggressive management of  his low  back and leg pain.  He does not need a refill on his Percocet today.  I  will see him back in a month.  We will give Dr. Shon Baton a call to review  case as well, as I have no imaging studies at this time to review.           ______________________________  Derek Sherman,  M.D.     DMK/MedQ  D:  08/02/2008 10:22:29  T:  08/02/2008 23:02:15  Job #:  [id number]   cc:   Alvy Beal, MD  Fax: [phone]

## 2010-07-28 NOTE — Procedures (Signed)
Derek, Sherman NO.:  [id number]   MEDICAL RECORD NO.:  [id number]          PATIENT TYPE:  REC   LOCATION:  [facility]                          FACILITY:  [facility]   PHYSICIAN:  Celene Kras, MD        DATE OF BIRTH:  [birth year]   DATE OF PROCEDURE:  DATE OF DISCHARGE:                               OPERATIVE REPORT   Derek [facility] and I  evaluated him.  I reviewed the Health and History form.  I reviewed the  14-point review of systems.   1. Derek Sherman comes in today and he is with the [employer]      Nurse Elie Goody, RN, BA, CCM, and [employer], RN, [place], [place],      Sports coach.  We discussed treatment limitations and options, and      sequentially, we will reblock him today, using 0.5% Marcaine to      assess efficacy.  I believe, if he has a spondylitic pain, and      diagnostic and therapeutic, these interventions will help [place]      understand if RF is a good option.  Less seemingly less convincing      SI pain, but he does have that.  He does have added biomechanical      stress above and below his disk placement at L5-S1.  2. So, it could be commended that he has quit smoking.  3. Darvocet was really not particularly useful, and he has had      resistance to many different medications, he brings these in and it      is appropriate.  UDS has been appropriate, and I think it is      reasonable to at least trial an agent shift.  We will go on to      Nucynta.  4. Follow him expectantly.  I will plan another intervention and I      will also see how he does.  We talked about stimulation, I do not      think he is ready for that, left leg pain, almost none at all and      more spondylitic pain with hoping that this intervention will      direct [place] with benchmarks, 50-75% relief cycling toward RF as our      best nonsurgical course of care.  Vocational retraining should be      strongly entertained.   Objectively, diffuse paralumbar myofascial, according to test positive  pain with extension with side bending.  I do not find anything new from  a neurological perspective.   IMPRESSION:  Bell's palsy, lumbar spine, spondylosis, and minimal  myelopathy.   PLAN:  Facet medial branch intervention L5, 4, 3, and 2 right and left  side.  Independent needle access points under local anesthetic.  Predicated further intervention based on need and over response.  Questions were answered and discussed in lay terms.  We will assess  this, we will see him in 2-3 weeks and will determine further course  of  care.   The patient was taken to fluoroscopy suite and placed in prone position.  Back prepped and draped in usual fashion.  Using a 22-gauge spinal  needle, I advanced the facet at the medial branch of L5, 4, 3, and 2  right and left side.  Independent needle access points confirmed  placement.  Inject 1 mL of 0.5% Marcaine and MPF, each level with 40 mg  of Aristocort in divided dose.   He tolerated procedure well.  No complications from our procedure.  Discharge instructions given.  Assessment in the context of activities  of daily living.  I will see him in followup.           ______________________________  Celene Kras, MD     HH/MEDQ  D:  01/16/2008 14:39:51  T:  01/17/2008 02:40:48  Job:  [id number]

## 2010-07-28 NOTE — Assessment & Plan Note (Signed)
Derek [facility].  I evaluated  him and reviewed the Health and History form and 14-point review of  systems.   1. He comes to [place] today with stable presentation, cardiac rehab in      progress.  2. Other modifiable features in health profile discussed.  Again home-      based therapy, cardiac rehab as recommended, currently on Plavix,      interventional procedures will be considered.  3. Medical management.  Start coming down on the Percocet slowly.  We      will go down to 3 possibly 4 a day, and then hopefully start moving      to nonnarcotic medication alternatives.  Pain control during his      rehab will enhance potential with minimum stress, he understands      these medications and I have reviewed risk.  4. I am going to go ahead and see him in 1 month, from my perspective      he is essentially at [facility].   With his permission, case manager in the room, he understands that does  not mean withdrawal of care, it just means transitional course of care.   OBJECTIVE:  No significant interval change.  Intact from a neurological  perspective, his typical pain which is stable.   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   PLAN:  Conservative management.  Discharge instructions.  We will see  him in about a month.           ______________________________  Celene Kras, MD     HH/MedQ  D:  04/09/2008 11:59:35  T:  04/10/2008 02:54:30  Job #:  [id number]

## 2010-07-28 NOTE — Assessment & Plan Note (Signed)
Derek [facility].  I evaluated  him via the Health and History form and 14-point review of systems.   1. Accompanied with his case manager, his permission discussed with      her.  We planned facet medial branch intervention at 5, 4, 3, and      2, right and left side under local anesthetic.  Sequential      intervention in followup.  Risks, complications, and options were      reviewed as well as benchmarks.  2. Consider RF as an option, but he will have to demonstrate 50-75%      diminution in pain perception, improved function, and range of      motion activities.  I have reviewed that with him.  3. Modifiable features in health profile were reviewed.  We will      follow up with him.   Objectively, no significant interval change.  No change neurologically,  mostly myofascial spondylotic pain, mechanical pain is evident  particularly with extension and side bending.   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   PLAN:  1. Facet medial branch intervention 5, 4, 3, and 2 right and left      side, independent needle access points under local anesthetic.      Predicate further intervention based on need and overall response,      he has consented.  Follow up in 2-3 weeks.  2. He wants something to sleep, so we will start with Atarax and avoid      escalating controlled substances.  We will also switch him from      Roxicodone to Pamelor and follow him expectantly as hopefully we      can reduce to Ultram over time.  The rationale for performing this      intervention is to move in that direction.  He has consented for      today's procedure.   IMPRESSION:  Spondylosis with minimal myelopathy.   PLAN:  Facet medial branch intervention.  He has consented.   The patient taken to the fluoroscopy suite and placed in prone position.  Back prepped and draped in the usual fashion.  Using a 22-gauge spinal  needle,  I advanced to the facet at the medial branch at 5, 4, 3, and 2  with independent needle access points right and left side, confirmed  placement in multiple fluoroscopic positions, and then followed with 1  mL of lidocaine and 1% NPF at each level with a total of 40 mg of  Aristocort in divided dose.   Tolerated the procedure well.  No complications from our procedure.  Appropriate recovery.  Discharge instructions given.           ______________________________  Celene Kras, MD     HH/MedQ  D:  12/05/2007 10:57:38  T:  12/06/2007 02:20:36  Job #:  [id number]

## 2010-08-12 NOTE — Assessment & Plan Note (Signed)
Derek Sherman is a pleasant 50 year old gentleman who is [facility].  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  He is back in today for refill of his medications.  He has had problems.  Over the last few months, he has had stroke with some left-sided facial weakness and upper extremity weakness.  He is also seeing a Careers adviser for neck pain.  Apparently, he has seen a neurosurgeon, Dr. Orlie Pollen and has a surgery scheduled for his neck on next Tuesday, which is June 23, 2010.  He is requesting a refill on his pain medications per our clinic today, Vicodin which he takes up to 3-4 times a day as well as his gabapentin.  He still continues to have complaints of low back pain and lower extremity pain.  Pain is about 8 on a scale of 10, worse with standing, improves with medication.  Functional status is overall high functioning gentleman.  He is also single father taking care of three small children.  Denies any new problems with bowel or bladder.  No new numbness, tingling, or weakness.  No other changes in the past medical, social, or family history.  Medications prescribed per [facility] include Vicodin 10/500 up to 4 times per day, gabapentin 300 mg one p.o. q.8 a.m., noon, 3:00 p.m., 7:00 p.m., 2 at bedtime.  PHYSICAL EXAMINATION:  VITAL SIGNS:  Blood pressure is 161/90, pulse 82, respirations 18, 96% saturated on room air. GENERAL:  He is a well-developed, well-nourished gentleman, mildly obese.  He is oriented x3. NEUROLOGIC:  Speech is clear.  Affect is bright.  He is alert, cooperative, and pleasant.  Follows commands without difficulty, answers my questions appropriately.  His reflexes and lower extremities are diminished.  No abnormal tone, clonus, or  tremors are noted.  His motor strength in lower extremities in the 5/5 range.  He is able to transition easily from sitting to standing.  Gait is not antalgic today. He has a very limited lumbar motion with forward flexion and especially in extension.  He complains some discomfort.  No tenderness with palpation in the lumbar spine as noted today.  He does have some mild flexion contractures of both hips as well but internal-external rotation at the hips does not aggravate him.  PROBLEM LIST:  [facility] at [facility] include: 1. Lumbago. 2. Status post L5-S1 deplete total disk replacement. 3. Neurogenic claudication. 4. Bilateral hip flexion contracture.  PLAN:  We will refill his Vicodin up to 4 times per day, #40; and gabapentin as noted above one p.o. q.8 a.m., noon, 3:00 p.m., 7:00 p.m., and 2 at bedtime.  We will continue to monitor his narcotic pain medication use.  We will check random urine drug screens.  His last urine drug screen was on March 12, 2010, and was consistent.  He has been taking his medications as prescribed without evidence of aberrant behavior.  He is currently following up with a neurosurgeon for new neck pain that he has been experiencing, and he is anticipating a surgery in the upcoming week or so.  I will see him back in 6 weeks, would like his surgeon to prescribe his postop pain medication during the postop.  Mr. Stankowski is in agreement.     Brantley Stage, M.D. Electronically Signed  DMK/MedQ D:  06/15/2010 11:07:35  T:  06/16/2010 04:50:04  Job #:  [id number]

## 2010-08-24 ENCOUNTER — Encounter: Payer: Worker's Compensation | Admitting: Neurosurgery

## 2010-08-28 ENCOUNTER — Encounter: Payer: Worker's Compensation | Attending: Neurosurgery | Admitting: Neurosurgery

## 2010-08-28 DIAGNOSIS — G8929 Other chronic pain: Secondary | ICD-10-CM | POA: Insufficient documentation

## 2010-08-28 DIAGNOSIS — I739 Peripheral vascular disease, unspecified: Secondary | ICD-10-CM | POA: Insufficient documentation

## 2010-08-28 DIAGNOSIS — M545 Low back pain, unspecified: Secondary | ICD-10-CM | POA: Insufficient documentation

## 2010-08-28 DIAGNOSIS — M24559 Contracture, unspecified hip: Secondary | ICD-10-CM | POA: Insufficient documentation

## 2010-08-29 NOTE — Assessment & Plan Note (Signed)
This is a patient who is followed by Dr. Pamelia Hoit for a workers' compensation injury status post L5-S1 total disk replacement.  He has had chronic pain and neurogenic claudication since that time.  Surgery was in 2008.  He rates his average pain on about an 8 scale.  It is stabbing, aching- type pain.  General activity level is 7-8.  Pain is same 24 hours a day. Sleep patterns are fair.  Pain is worse with most all activity.  Rest and medication tend to help.  MOBILITY:  Walks without assistance.  He can walk about 10 minutes, climb steps and drives.  REVIEW OF SYSTEMS:  Notable for those difficulties, otherwise within normal limits.  PAST MEDICAL HISTORY:  Unchanged.  SOCIAL HISTORY:  Unchanged.  PHYSICAL EXAMINATION:  VITAL SIGNS:  Blood pressure 165/98, pulse 79, respirations 18, and O2 sats 98 on room air. NEUROLOGIC:  His motor strength is good in lower extremities, 5/5 bilaterally.  Sensation appears to be in intact.  Constitutionally, he is within normal limits.  He is alert and oriented x3.  His gait is normal.  IMPRESSION: 1. Status post cervical spine surgery in 2012. 2. Status post total disk replacement in 2008, L5-S1. 3. History of chronic low back pain. 4. Neurogenic claudication. 5. Hip contractures bilaterally.  PLAN: 1. I went ahead and change him back with Dr. Leretha Dykes approval to     Vicodin 5/500 one p.o. q.i.d. p.r.n., #120 with no refill. 2. He will follow up in the clinic in 1 month.  Appointment has     already been made.  His questions were encouraged and answered.     Belynda Pagaduan L. Blima Dessert Electronically Signed    RLW/MedQ D:  08/28/2010 12:42:12  T:  08/29/2010 02:55:46  Job #:  [id number]

## 2010-09-24 ENCOUNTER — Encounter: Payer: Worker's Compensation | Attending: Physical Medicine and Rehabilitation | Admitting: Neurosurgery

## 2010-09-24 DIAGNOSIS — M79609 Pain in unspecified limb: Secondary | ICD-10-CM | POA: Insufficient documentation

## 2010-09-24 DIAGNOSIS — Z79899 Other long term (current) drug therapy: Secondary | ICD-10-CM | POA: Insufficient documentation

## 2010-09-24 DIAGNOSIS — M545 Low back pain, unspecified: Secondary | ICD-10-CM | POA: Insufficient documentation

## 2010-09-24 DIAGNOSIS — I739 Peripheral vascular disease, unspecified: Secondary | ICD-10-CM | POA: Insufficient documentation

## 2010-09-24 DIAGNOSIS — I69992 Facial weakness following unspecified cerebrovascular disease: Secondary | ICD-10-CM | POA: Insufficient documentation

## 2010-09-24 DIAGNOSIS — R29898 Other symptoms and signs involving the musculoskeletal system: Secondary | ICD-10-CM | POA: Insufficient documentation

## 2010-09-24 DIAGNOSIS — M62838 Other muscle spasm: Secondary | ICD-10-CM | POA: Insufficient documentation

## 2010-10-09 ENCOUNTER — Encounter: Payer: Worker's Compensation | Attending: Neurosurgery | Admitting: Neurosurgery

## 2010-10-09 DIAGNOSIS — G8929 Other chronic pain: Secondary | ICD-10-CM | POA: Insufficient documentation

## 2010-10-09 DIAGNOSIS — M5430 Sciatica, unspecified side: Secondary | ICD-10-CM

## 2010-10-09 DIAGNOSIS — M961 Postlaminectomy syndrome, not elsewhere classified: Secondary | ICD-10-CM | POA: Insufficient documentation

## 2010-10-10 NOTE — Assessment & Plan Note (Signed)
This is a patient of Dr. Pamelia Hoit who has seen for a history of workers' comp injury.  He saw Dr. Pamelia Hoit last month and will follow up next month due to the fact that workers' comp will not approve for him to see me or at least for me to sign his prescriptions.  Today, he reports no changes in the condition.  His pain is about 7 or 8.  It is stabbing, constant-type pain.  His activity level is 7-8.  The pain is worse in the morning and night.  Sleep patterns are fair.  Pain is worse with walking, sitting, standing and activity.  Rest and medication tend to help.  He walks without assistance.  He drives and climb stairs without problems.  Functionally, he is on disability.  REVIEW OF SYSTEMS:  Notable for difficulty as described above as well as depression.  No suicidal thoughts or aberrant behavior.  His Oswestry score is 68.  PAST MEDICAL HISTORY:  Unchanged.  SOCIAL HISTORY:  Unchanged.  FAMILY HISTORY:  Unchanged.  Physical exam, his blood pressure is 160/96, pulse 78, respirations 18, O2 sats 97 on room air.  Motor strength is good in his lower extremities.  His sensation is intact.  Constitutionally, he is within normal limits.  He is oriented x3.  IMPRESSION: 1. Post cervical laminectomy syndrome. 2. Post laminectomy syndrome, chronic pain. 3. Neurogenic claudication.  PLAN: 1. Refill his Vicodin 5/500 one p.o. q.i.d. 120 with no refill. 2. We are going to start him on Robaxin 500 mg 1 p.o. b.i.d. 60 with 2     refills. 3. He will follow up with Dr. Pamelia Hoit as scheduled in 1 month.  His     questions were encouraged and answered.     Derek Sherman L. Blima Dessert Electronically Signed    RLW/MedQ D:  10/09/2010 15:21:24  T:  10/10/2010 00:59:52  Job #:  [id number]

## 2010-11-09 ENCOUNTER — Encounter
Payer: Worker's Compensation | Attending: Physical Medicine and Rehabilitation | Admitting: Physical Medicine and Rehabilitation

## 2010-11-09 DIAGNOSIS — M545 Low back pain, unspecified: Secondary | ICD-10-CM | POA: Insufficient documentation

## 2010-11-09 DIAGNOSIS — Z79899 Other long term (current) drug therapy: Secondary | ICD-10-CM | POA: Insufficient documentation

## 2010-11-09 DIAGNOSIS — G894 Chronic pain syndrome: Secondary | ICD-10-CM

## 2010-11-09 DIAGNOSIS — E7800 Pure hypercholesterolemia, unspecified: Secondary | ICD-10-CM | POA: Insufficient documentation

## 2010-11-09 DIAGNOSIS — I252 Old myocardial infarction: Secondary | ICD-10-CM | POA: Insufficient documentation

## 2010-11-09 DIAGNOSIS — G8929 Other chronic pain: Secondary | ICD-10-CM | POA: Insufficient documentation

## 2010-11-09 DIAGNOSIS — Z9689 Presence of other specified functional implants: Secondary | ICD-10-CM | POA: Insufficient documentation

## 2010-11-09 DIAGNOSIS — I10 Essential (primary) hypertension: Secondary | ICD-10-CM | POA: Insufficient documentation

## 2010-11-09 DIAGNOSIS — Z8673 Personal history of transient ischemic attack (TIA), and cerebral infarction without residual deficits: Secondary | ICD-10-CM | POA: Insufficient documentation

## 2010-11-09 DIAGNOSIS — M24559 Contracture, unspecified hip: Secondary | ICD-10-CM | POA: Insufficient documentation

## 2010-11-09 NOTE — Assessment & Plan Note (Signed)
[name] is a pleasant 50 year old gentleman with a history of stroke, myocardial infarction, hypertension, who is followed at our [facility] for chronic pain complaints related to his low back pain.  He also recently underwent cervical spine surgery per Dr. Orlie Pollen June 25, 2010.  He is a worker's compensation patient who had an L5-S1 DePuy, total disk replacement by Dr. Lorie Phenix in June 2008.  He has chronic low back pain and neurogenic claudication as well as bilateral hip flexion contractures.  He is back in today for refill of his pain medications.  He continues to use Vicodin and gabapentin as well as a p.r.n. muscle relaxant and nonsteroidal anti-inflammatory medications are avoided in this gentleman due to his comorbidity.  He states that he has had increasing low back pain over the last 2-3 months.  It is located just at the lumbosacral junction as he indicates in the room.  Pain is about 8 on a scale of 10, worse with activities. Pain improves with rest and medication.  He is getting some relief with current medications.  Pain is described as rather constant, stabbing, and sharp in nature.  FUNCTIONAL STATUS:  He is able to walk about half hour at a time.  He is able to climb stairs and drive.  He is independent with self-care.  He cares for 3 children at home.  Denies new problems with bowel or bladder.  Denies new numbness, tingling, weakness.  Denies any new trouble walking.  Admits to depression but denies suicidal ideation.  Review of systems otherwise noncontributory.  Past medical, social, family history are unchanged.  Past medical history is remarkable for myocardial infarction 2009, stroke 2006, hypercholesterolemia as well as hypertension.  MEDICATIONS:  Medications he is currently taking is attached to the chart.  Per [facility], he uses gabapentin 4 times a day plus 2 at night as  well as hydrocodone 5/500, 4 times a day, Robaxin p.r.n.  PHYSICAL EXAMINATION:  Blood pressure is 155/100.  Denies headaches, visual changes or any other neurologic symptoms.  He has a followup appointment with his primary care physician this week.  Pulse is 84, respiration 18, 99% saturated on room air.  He is a well-developed, well- nourished gentleman who does not appear in any distress.  He is oriented x3.  Speech is clear.  Affect is bright.  He is alert, cooperative, and pleasant.  Follows commands without difficulty, answers my questions appropriately.  Cranial nerves coordination are grossly intact.  His reflexes are brisk in the lower extremities.  Motor strength is good, 5/5 at hip flexors, knee extensors, dorsiflexors, plantar flexors, EHL. Sensation is intact in both lower extremities without side-to-side deficit.  He is able to transition easily from sitting to standing.  His gait is stable.  Tandem gait is performed without difficulty.  He has very limited motion with respect to flexion as well as extension. Palpation in the lumbar paraspinal muscles and over the lumbar spinous processes does not aggravate pain.  IMPRESSION: 1. Status post L5-S1 DePuy total disk replacement, Dr. Lorie Phenix 2008     with chronic low back pain. 2. History of neurogenic claudication. 3. Bilateral hip flexion contracture. 4. Recent cervical spine surgery per Dr. Orlie Pollen June 25, 2010.  PLAN:  Since he has had a history of increasing low back pain over the last several months, we will obtain some flexion/extension radiographs of his lumbar spine.  He is in agreement with  this.  We will refill his pain medications and will increase his dose slightly.  He is currently on Vicodin 5/500, 4 times a day.  I will switch him to Vicodin 7.5/325, 4 times a day.  I refilled his gabapentin as well as his Robaxin.  Fill date on his Vicodin was October 09, 2010.  Today's date is November 09, 2010.  He has no  medications.  His pill counts are appropriate.  His last urine drug screen August 28, 2010 was consistent.  He takes medications as prescribed.  No evidence of aberrant behavior is observed.  Nonsteroidals are avoided with him due to comorbidities currently.  I have answered all of his questions.  He is comfortable with our plan at this time.     Brantley Stage, M.D. Electronically Signed    DMK/MedQ D:  11/09/2010 14:22:39  T:  11/09/2010 22:27:09  Job #:  [id number]

## 2010-12-07 ENCOUNTER — Encounter
Payer: Worker's Compensation | Attending: Physical Medicine and Rehabilitation | Admitting: Physical Medicine and Rehabilitation

## 2010-12-07 DIAGNOSIS — M545 Low back pain, unspecified: Secondary | ICD-10-CM | POA: Insufficient documentation

## 2010-12-07 DIAGNOSIS — R209 Unspecified disturbances of skin sensation: Secondary | ICD-10-CM | POA: Insufficient documentation

## 2010-12-07 DIAGNOSIS — Z9689 Presence of other specified functional implants: Secondary | ICD-10-CM | POA: Insufficient documentation

## 2010-12-07 DIAGNOSIS — Z79899 Other long term (current) drug therapy: Secondary | ICD-10-CM | POA: Insufficient documentation

## 2010-12-07 DIAGNOSIS — I2510 Atherosclerotic heart disease of native coronary artery without angina pectoris: Secondary | ICD-10-CM | POA: Insufficient documentation

## 2010-12-07 DIAGNOSIS — M5380 Other specified dorsopathies, site unspecified: Secondary | ICD-10-CM

## 2010-12-07 DIAGNOSIS — M24559 Contracture, unspecified hip: Secondary | ICD-10-CM | POA: Insufficient documentation

## 2010-12-07 DIAGNOSIS — I10 Essential (primary) hypertension: Secondary | ICD-10-CM | POA: Insufficient documentation

## 2010-12-07 DIAGNOSIS — I69998 Other sequelae following unspecified cerebrovascular disease: Secondary | ICD-10-CM | POA: Insufficient documentation

## 2010-12-07 DIAGNOSIS — G894 Chronic pain syndrome: Secondary | ICD-10-CM

## 2010-12-07 DIAGNOSIS — G8929 Other chronic pain: Secondary | ICD-10-CM | POA: Insufficient documentation

## 2010-12-07 DIAGNOSIS — I252 Old myocardial infarction: Secondary | ICD-10-CM | POA: Insufficient documentation

## 2010-12-07 NOTE — Assessment & Plan Note (Signed)
Derek Sherman is a pleasant 50 year old gentleman who is followed here at [facility].  He has a worker's compensation injury and is status post L5-S1 deplete total disk replacement, which is complicated by chronic low back pain and neurogenic claudication.  He also has severe hypertension, which is managed by his primary doctor, Dr. Excell Seltzer.  He has coronary artery disease.  He is status post a stroke in 2006 and myocardial infarction in 2009.  He is back in today, states that his back pain has been getting worse over the last few months, it limits him from sitting and walking, sitting not more than 10 minutes, standing and walking not more than 15, possibly 20 minutes.  Pain is rather constant throughout the day and improves with rest and medication.  He gets fair relief with current meds.  FUNCTIONAL STATUS:  He can walk about 10 minutes.  He is able to climb stairs.  He drives.  He is independent with self-care.  Denies new problems with bowel or bladder.  Reports numbness especially left upper extremity status post stroke.  Admits to depression, but is not suicidal and does not have any suicidal thoughts.  No other changes in past medical, social, family history other than recent visit with Dr. Excell Seltzer.  He had some changes in his hypertensive meds.  [facility]ications [facility] through [facility] include gabapentin 300 mg one p.o. at 8 a.m., at noon, 3 p.m., and 7 p.m., 2 at bedtime, hydrocodone 7.5/325 one p.o. q.i.d. #120.  PHYSICAL EXAMINATION:  Today blood pressure is 193/92, pulse 72, respirations 16, 98% saturation on room air.  He is well-developed, mildly obese gentleman who does not appear in any distress.  He is oriented x3.  Speech is clear.  Affect is bright.  He is alert, cooperative, and pleasant.  Follows commands without difficulty. Answers my questions appropriately.  Cranial nerves and coordination is intact in the right  upper extremity.  Diminished coordination is noted in the left upper extremity especially fine motor  Motor strength is good in both lower extremities, 5/5 at hip flexors, knee extensors, dorsiflexors, plantar flexors.  Reflexes are brisk in both upper extremities.  He has Hoffmann sign on the left as well as couple beats of clonus in the lower extremities.  He is able to transition easily from sitting to standing.  Gait is not antalgic, but he does have a forward flexed back as he walks.  He reports pain with extension and he has some limitations in flexion, but does not report pain with lumbar flexion.  Radiographs of the lumbar spine were obtained dated November 20, 2010, the report is attached to the chart read by Dr. Jordan Likes, III from [facility].  I reviewed the findings with Mr. Szymczak today as well.  Briefly, no evidence of subluxation, anterolisthesis, or retrolisthesis was noted on the flexion/extension views.  Facet hypertrophy about 4-5 and S1 level was however noted.  Old compression fracture of the T12 level was also noted.  IMPRESSION: 1. Worsening of chronic low back pain over the last several months     status post L5-S1 deplete total disk replacement per Dr. Casimer Lanius     in June 2008.  Recent radiographs do not show any listhesis, but do     show old T12 compression as well as facet arthropathy at lower     lumbar segments L4-5 and L5-S1.  The patient reports increased pain  with extension during standing. 2. Recent cervical spine surgery by Dr. Orlie Pollen June 25, 2010. 3. History of chronic low back pain. 4. Neurogenic claudication. 5. Bilateral hip flexion contracture.  Medical problems include history of coronary artery disease status post MI in 2009, history of stroke in 2006, significant hypertension managed by Dr. Excell Seltzer.  PLAN:  I reviewed various treatment options with Mr. Siddoway  today including:  1. Doing nothing new at this time. 2. Consideration of increasing narcotics; however, he understands that     pain relief may be temporary and he may become more tolerant to the     narcotics. 3. We discussed the use of NSAIDs; however, given his history of     hypertension, stroke, and cardiac disease, this is probably not in     his best interest.  We have also considered steroids, he would like     to avoid this option as well. 4. Medial branch blocks are an option he would like to consider. 5. We have also discussed possible followup with surgeon, Dr. Shon Baton. 6. He has had quite a bit of physical therapy in the past and is not     interested in pursuing more therapy at this time.  His last urine drug screen was June 2012 and was consistent.  His pill counts are appropriate today.  He does not display any aberrant behavior with the use of his narcotic pain medication.  I have written a prescription for Vicodin 7.5/325 one p.o. q.i.d. #120.  He does not need a prescription for his gabapentin or his Robaxin, which he takes sparingly.  I have answered all his questions.  He is comfortable with our plan at this time.  I will have him follow up with Dr. Wynn Banker for medial branch blocks.     Brantley Stage, M.D. Electronically Signed    DMK/MedQ D:  12/07/2010 10:21:47  T:  12/07/2010 10:42:13  Job #:  [id number]

## 2011-01-04 ENCOUNTER — Encounter
Payer: Worker's Compensation | Attending: Physical Medicine and Rehabilitation | Admitting: Physical Medicine and Rehabilitation

## 2011-01-04 DIAGNOSIS — M545 Low back pain, unspecified: Secondary | ICD-10-CM

## 2011-01-04 DIAGNOSIS — G894 Chronic pain syndrome: Secondary | ICD-10-CM

## 2011-01-04 DIAGNOSIS — M24559 Contracture, unspecified hip: Secondary | ICD-10-CM | POA: Insufficient documentation

## 2011-01-04 DIAGNOSIS — I739 Peripheral vascular disease, unspecified: Secondary | ICD-10-CM | POA: Insufficient documentation

## 2011-01-04 DIAGNOSIS — G8929 Other chronic pain: Secondary | ICD-10-CM | POA: Insufficient documentation

## 2011-01-04 DIAGNOSIS — I10 Essential (primary) hypertension: Secondary | ICD-10-CM | POA: Insufficient documentation

## 2011-01-05 NOTE — Assessment & Plan Note (Signed)
Derek Sherman is pleasant 50 year old gentleman who is followed here at the [facility] for chronic pain complaints related to his low back.  He has a worker's compensation injury and is status post an L5-S1 DePuy total disk replacement which is complicated by chronic low back pain and neurogenic claudication.  He also has some medical problems which include severe hypertension which is managed by his primary care Dr. Excell Seltzer.  He has coronary artery disease.  He is status post stroke in 2006, and myocardial infarction in 2009.  He is back in today and reports his pain seems to be worsening in the low back.  It is worse with walking and standing, also aggravated somewhat by sitting.  Pain improves with rest and medication.  Average pain is between 8 and 9 on a scale of 10.  It is described as stabbing, aching, and rather constant.  FUNCTIONAL STATUS:  He walks without assistance.  He can walk about 10 minutes.  He is able to climb stairs and drive.  He is independent with self-care.  He does try to help out around the house.  He does some light house keeping tasks as well.  REVIEW OF SYSTEMS:  Otherwise negative.  PAST MEDICAL, SOCIAL, FAMILY HISTORY:  Unchanged from previous exam.  PHYSICAL EXAMINATION:  VITAL SIGNS:  On exam today, his blood pressure is 177/90.  He states he ran out of his blood pressure medicines 2 days ago when he intends to get them filled.  His pulse is 70, respirations 18, 96% saturated on room air. GENERAL:  He is a well-developed, well-nourished gentleman who does not appear in any distress. NEUROLOGIC:  He is oriented x3.  Speech is clear.  Affect is bright.  He is alert, cooperative, and pleasant.  Follows commands without difficulty.  Answers my questions appropriately.  Cranial nerves, coordination are intact, grossly he does have some diminished coordination in the left upper extremity with fine motor. MUSCULOSKELETAL:   Motor strength is generally good, both upper and lower extremities.  I do not have a Hoffman's today on him on the left, he is a couple beats of clonus in the left lower extremity.  Reflexes are overall brisk.  He transitions from sitting to standing easily.  Gait is non antalgic, but he does have a forward flex back as he walks.  He reports pain with extension.  He is limited in extension, has a rather flat back.  IMPRESSION: 1. Chronic low back pain status post L5-S1, DePuy total disk     replacement, by Dr. Lorie Phenix, June of 2008. Radiographs from November 20, 2010, did not show listhesis.  They do show old T12 compression fracture as well as facet arthropathy at L4-5 and L5-S1. 1. Recent cervical spine surgery Dr. Orlie Pollen on June 25, 2010. 2. History of chronic low back pain. 3. Neurogenic claudication. 4. Bilateral hip flexion contracture.  Medical problems include history of coronary artery disease, status post myocardial infarction in 2009, history of stroke in 2006, these are managed by Dr. Excell Seltzer.  PLAN:  Reviewing his old chart, he did see Dr. Stevphen Rochester for medial branch blocks on January 16, 2008.  Apparently at that time, he did not get significant relief from these branch blocks.  It has been 4 years, recent radiograph suggests he does have arthropathy in L4-5 and L5-S1. There is a chance he may get some relief with that and I think considering a trial of medial branch blocks again should be  considered.  There is some concern of insurance coverage of these for him.  We have discussed this in clinic today.  Mr. Nyquist is adamant that he have some kind of pain relief for his worsening back pain, and would like to try slightly elevated dose of his hydrocodone, he is currently taking 7.5/325 mg 4 times a day.  We will increase it to 10/325, 4 times a day. He is not a good candidate for NSAID use due to his cardiovascular history.  He seems to be satisfied with this option  currently.  We will see him back in 2 months, nurse practitioner next month for refill of his pain medications.  We will continue to monitor the use of his hydrocodone as well as check random urine drug screens.  He seems comfortable with the plan at this time.     Brantley Stage, M.D. Electronically Signed    DMK/MedQ D:  01/04/2011 11:52:58  T:  01/05/2011 01:20:20  Job #:  [id number]

## 2011-02-02 ENCOUNTER — Encounter: Payer: Worker's Compensation | Attending: Neurosurgery | Admitting: Neurosurgery

## 2011-02-02 DIAGNOSIS — G894 Chronic pain syndrome: Secondary | ICD-10-CM

## 2011-02-02 DIAGNOSIS — M24559 Contracture, unspecified hip: Secondary | ICD-10-CM | POA: Insufficient documentation

## 2011-02-02 DIAGNOSIS — M545 Low back pain, unspecified: Secondary | ICD-10-CM | POA: Insufficient documentation

## 2011-02-02 DIAGNOSIS — G8929 Other chronic pain: Secondary | ICD-10-CM | POA: Insufficient documentation

## 2011-02-02 NOTE — Assessment & Plan Note (Signed)
This is a patient of Dr. Pamelia Hoit who is seen for low back pain.  It is a Workers' Comp injury.  She did recommend some injection and the Workers' Compensation has denied that.  It is in appeal process.  He rates his pain at 8 or 9.  It is a sharp, stabbing, and aching type pain that is constant.  The pain is same 24 hours a day.  General activity level is 9.  Sleep patterns are poor.  Walking, sitting and standing aggravate.  Rest and medication helps.  He walks without assistance.  He climb steps and drives and walk about 10 minutes at a time.  He is on disability.  REVIEW OF SYSTEMS:  Notable for difficulties described above, otherwise within normal limits.  Last UDS and pill counts were correct.  PAST MEDICAL HISTORY:  Unchanged.  SOCIAL HISTORY:  Unchanged.  FAMILY HISTORY:  Unchanged.  PHYSICAL EXAMINATION:  VITAL SIGNS:  His blood pressure 158/89, pulse 80, respirations 18, O2 sats 98 on room air. NEUROLOGIC:  His motor strength and sensation are intact. CONSTITUTIONAL:  He is weight is within normal limits.  He is alert and oriented x3.  His affect is bright.  He has a normal gait.  ASSESSMENT: 1. Chronic low back pain, status post back surgery with Dr. Montey Hora. 2. Chronic low back pain. 3. Neurogenic claudication. 4. Bilateral hip flexion and contractures.  PLAN: 1. Await to hear from Workers' Comp regarding his injections. 2. Refill Vicodin 10/325 one p.o. q.i.d. 120 with no refill. 3. Gabapentin 300 mg one p.o. q.a.m. 8 a.m., 12 noon, 3 p.m. 2 at     night, 180 with 5 refills.  His questions were encouraged and     answered.  I will see him back next month.     Derek Sherman L. Blima Dessert Electronically Signed    RLW/MedQ D:  02/02/2011 09:26:57  T:  02/02/2011 10:06:29  Job #:  [id number]

## 2011-03-01 ENCOUNTER — Encounter
Payer: Worker's Compensation | Attending: Physical Medicine and Rehabilitation | Admitting: Physical Medicine and Rehabilitation

## 2011-03-01 DIAGNOSIS — M545 Low back pain, unspecified: Secondary | ICD-10-CM | POA: Insufficient documentation

## 2011-03-01 DIAGNOSIS — R279 Unspecified lack of coordination: Secondary | ICD-10-CM | POA: Insufficient documentation

## 2011-03-01 DIAGNOSIS — I2510 Atherosclerotic heart disease of native coronary artery without angina pectoris: Secondary | ICD-10-CM | POA: Insufficient documentation

## 2011-03-01 DIAGNOSIS — G894 Chronic pain syndrome: Secondary | ICD-10-CM

## 2011-03-01 DIAGNOSIS — I10 Essential (primary) hypertension: Secondary | ICD-10-CM | POA: Insufficient documentation

## 2011-03-01 DIAGNOSIS — I69998 Other sequelae following unspecified cerebrovascular disease: Secondary | ICD-10-CM | POA: Insufficient documentation

## 2011-03-01 DIAGNOSIS — Z9689 Presence of other specified functional implants: Secondary | ICD-10-CM | POA: Insufficient documentation

## 2011-03-01 DIAGNOSIS — M24559 Contracture, unspecified hip: Secondary | ICD-10-CM | POA: Insufficient documentation

## 2011-03-01 DIAGNOSIS — I252 Old myocardial infarction: Secondary | ICD-10-CM | POA: Insufficient documentation

## 2011-03-01 DIAGNOSIS — M5380 Other specified dorsopathies, site unspecified: Secondary | ICD-10-CM

## 2011-03-01 DIAGNOSIS — G8929 Other chronic pain: Secondary | ICD-10-CM | POA: Insufficient documentation

## 2011-03-02 NOTE — Assessment & Plan Note (Signed)
[name] is a pleasant 50 year old gentleman who is followed here at [facility] for chronic low back complaints.  He has a worker's compensation injury, status post an L5-S1 DePuy total disk replacement which was complicated by chronic low back pain and neurogenic claudication.  He also has hip flexion, knee flexion contractures.  His medical problems include history of severe hypertension which is managed by his primary care doctor, Dr. Excell Sherman. 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  He is back in today.  His average pain is between 8 and 9 on a scale of 10.  He reports constant low back pain, sharp, stabbing, aching in nature.  He indicates his pain interferes almost completely with general activity, relation with other, enjoyment of life.  Pain is worse during the day, evening and night.  Sleep is poor.  Pain is worse with walking, standing and sitting, improves with rest and medication.  He reports essentially no relief with current medications.  FUNCTIONAL STATUS:  He can walk about 10 minutes at a time.  He can climb stairs.  He is driving.  He walks without assistance.  He is not using a cane today.  He is independent with self-care.  REVIEW OF SYSTEMS:  Negative for problems with bowel or bladder control. Denies suicidal ideation.  No other changes were noted regarding review of systems.  PAST MEDICAL, SOCIAL AND FAMILY HISTORY:  Unchanged from previous visit.  EXAM:  His blood pressure is 141/83, pulse 68, respirations 16 and 96% saturated on room air.  He is 5 feet, 10 inches and 213 pounds.  Well- developed and well-nourished gentleman who does not appear in any distress today.  He is oriented x3.  Speech is clear.  His affect is bright.  He is alert, cooperative and pleasant.  Follows  commands without difficulty.  Answers my questions appropriately.  Cranial nerves are intact.  Coordination is diminished in the left hand.  His reflexes are 2+ at patellar and Achilles tendon bilaterally.  No clonus is noted. No abnormal tone is noted in the lower extremities.  Motor strength is good 5/5 at hip flexors, knee extensors, dorsiflexors, plantar flexors.  Transitions from sitting to standing without difficulty today.  Tandem gait is performed relatively well. Romberg test is normal.  Musculoskeletal exam reveals decreased range of motion in hips as well as knees bilaterally and limited lumbar flexion is noted also.  He complains of some pain with extension.  He has limited extension.  IMPRESSION: 1. Chronic low back pain, status post L5-S1 DePuy total disk     replacement by Dr. Kathlen Sherman June 2008.  Radiographs from November 20, 2010, did not show listhesis that did show an T12 compression     fracture as well as facet arthropathy at L4-5 and L5-S1. 2. Recent cervical spine surgery, Dr. Orlie Sherman June 25, 2010 3. History of low back pain. 4. Neurogenic claudication. 5. Bilateral hip flexion contracture.  Medical problemsinclude history of coronary artery disease, status post myocardial infarction in 2009, history of stroke in 2006, managed by Dr. Excell Sherman.  PLAN:  Derek Sherman has reported diminishing relief from his pain medications.  We have escalated his narcotic dosages over the last 2 months going from 7.5/325 up to 10/325 mg of hydrocodone.  Despite increases, he feels it has not been that helpful.  He still indicates on his pain inventory today that he gets essentially no relief from his medications.  His functional status appears to be rather stable since May.  He indicates he can walk about 10 minutes.  He does not use an assistive device.  He is able to climb stairs and drive and he still is able to manage his activities of daily living.  His recent  radiographs indicate he has an age-indeterminate anterior compression fracture at the T12 level.  I am not clear whether this has been assessed with imaging studies in the past.  I would like to request notes from the last 2 spine surgeons that Derek Sherman has seen to evaluate this.  We have discussed various treatment options for Derek Sherman.  At this time, he would like his pain medications refilled.  I will make no changes on that.  He continued to take his medications appropriately. His urine drug screens have been consistent and his pill counts are appropriate.  We have discussed considering more physical therapy to address the hip flexion and knee flexion contractures that he has.  We have also discussed consideration of some facet injections to assist him with his low back pain.  I am also going to request notes from the previous spine surgeons that he has seen in the past to evaluate further.  I am going to see him back next month.  He is currently comfortable with this treatment plan.     Derek Sherman, M.D. Electronically Signed    DMK/MedQ D:  03/01/2011 10:29:59  T:  03/02/2011 01:57:21  Job #:  [id number]

## 2011-03-29 ENCOUNTER — Encounter: Payer: Worker's Compensation | Admitting: Neurosurgery

## 2011-04-05 ENCOUNTER — Encounter: Payer: Worker's Compensation | Admitting: Neurosurgery

## 2011-04-26 ENCOUNTER — Encounter
Payer: Worker's Compensation | Attending: Physical Medicine and Rehabilitation | Admitting: Physical Medicine and Rehabilitation

## 2011-04-26 DIAGNOSIS — Z9689 Presence of other specified functional implants: Secondary | ICD-10-CM | POA: Insufficient documentation

## 2011-04-26 DIAGNOSIS — M961 Postlaminectomy syndrome, not elsewhere classified: Secondary | ICD-10-CM

## 2011-04-26 DIAGNOSIS — M4716 Other spondylosis with myelopathy, lumbar region: Secondary | ICD-10-CM

## 2011-04-26 DIAGNOSIS — Z79899 Other long term (current) drug therapy: Secondary | ICD-10-CM | POA: Insufficient documentation

## 2011-04-26 DIAGNOSIS — M545 Low back pain, unspecified: Secondary | ICD-10-CM | POA: Insufficient documentation

## 2011-04-26 DIAGNOSIS — M5380 Other specified dorsopathies, site unspecified: Secondary | ICD-10-CM

## 2011-04-26 NOTE — Assessment & Plan Note (Signed)
REASON FOR VISIT:  Increasing low back pain.  [name] is a 51 year old male who has a long history of work-related low back pain.  He has undergone L5-S1 disk replacement in 2006.  He has been followed in this clinic for greater than 5 years.  Initially, a patient of Dr. Stevphen Rochester, then becoming a patient of Dr. Pamelia Hoit.  He denies any falls.  His last visit with Dr. Pamelia Hoit was in December. He has had acupuncture treatments which were not helpful.  He had facet medial branch blocks, L2, L3, L4, L5 on January 16, 2008, per Dr. Celene Kras.  Current pain medicines, he has been escalated to Vicodin 10/325 q.i.d. He is also on gabapentin 300 mg 1 p.o. q.noon, 3:00 p.m. and 7 p.m., 2 at nighttime, 2 at 3:00 a.m.  PAST SURGICAL HISTORY:  Cervical spine surgery July 03, 2010.  CURRENT EXERCISE PROGRAM:  None except forced doing some stretches that physical therapy did with him.  REVIEW OF SYSTEMS:  Positive for depression.  PHYSICAL EXAMINATION:  VITAL SIGNS:  Blood pressure 161/99, pulse 80, respiratory rate is 18, O2 saturation 98% on room air.  Height 5 feet 8 inches, weight 210 pounds. GENERAL:  Mildly anxious male in no acute distress.  Orientation x3. Affect, alert. MUSCULOSKELETAL:  Gait is normal.  He has negative straight leg raising test.  He has pain in the SI joint area with Faber maneuver on the right side only.  His lumbar spine range of motion forward flexion is normal, but with extension, 0-25% with increased pain.  He has some tenderness around L4-5 and L5-S1 area.  Sensation normal.  Deep tendon reflexes are normal in the lower extremities.  Gait shows no evidence of toe drag or knee instability.  IMPRESSION: 1. Lumbar postlaminectomy syndrome. 2. Axial back pain.  I suspect that he has facet degeneration above     the level of the prior surgery.  Three and half years ago, he did     not get good relief with medial branch blocks.  However, his pain     has  progressed and he may well have more degenerative changes and     may be more responsive to that intervention 3. Other potential pain generators given his right SI appears to be     painful on Faber maneuver that brings up the this potential     possibility.  We will continue his hydrocodone and gabapentin.  I will see him back for the medial branch block.  He will follow with Dr. Pamelia Hoit as well.     Derek Sherman, M.D. Electronically Signed    AEK/MedQ D:  04/26/2011 10:10:15  T:  04/26/2011 22:57:22  Job #:  [id number]  cc:   Brantley Stage, M.D. Fax: [phone]  Elie Goody [employer]

## 2011-05-10 ENCOUNTER — Telehealth: Payer: Self-pay | Admitting: Physical Medicine and Rehabilitation

## 2011-05-10 MED ORDER — HYDROCODONE-ACETAMINOPHEN 10-325 MG PO TABS: 1.0000 | ORAL_TABLET | Freq: Four times a day (QID) | ORAL | Status: DC

## 2011-05-10 NOTE — Telephone Encounter (Signed)
R/f on hydrocodone. 

## 2011-05-10 NOTE — Telephone Encounter (Signed)
Refill sent in

## 2011-05-31 ENCOUNTER — Ambulatory Visit (HOSPITAL_BASED_OUTPATIENT_CLINIC_OR_DEPARTMENT_OTHER): Payer: Worker's Compensation | Admitting: Physical Medicine & Rehabilitation

## 2011-05-31 ENCOUNTER — Encounter: Payer: Self-pay | Admitting: Physical Medicine & Rehabilitation

## 2011-05-31 ENCOUNTER — Encounter: Payer: Worker's Compensation | Attending: Physical Medicine and Rehabilitation

## 2011-05-31 VITALS — BP 153/76 | HR 63 | Resp 16 | Ht 71.0 in | Wt 209.0 lb

## 2011-05-31 DIAGNOSIS — M47817 Spondylosis without myelopathy or radiculopathy, lumbosacral region: Secondary | ICD-10-CM | POA: Insufficient documentation

## 2011-05-31 DIAGNOSIS — M545 Low back pain, unspecified: Secondary | ICD-10-CM | POA: Insufficient documentation

## 2011-05-31 DIAGNOSIS — M961 Postlaminectomy syndrome, not elsewhere classified: Secondary | ICD-10-CM | POA: Insufficient documentation

## 2011-05-31 DIAGNOSIS — R279 Unspecified lack of coordination: Secondary | ICD-10-CM | POA: Insufficient documentation

## 2011-05-31 DIAGNOSIS — M24559 Contracture, unspecified hip: Secondary | ICD-10-CM | POA: Insufficient documentation

## 2011-05-31 DIAGNOSIS — G8929 Other chronic pain: Secondary | ICD-10-CM | POA: Insufficient documentation

## 2011-05-31 DIAGNOSIS — Z9689 Presence of other specified functional implants: Secondary | ICD-10-CM | POA: Insufficient documentation

## 2011-05-31 DIAGNOSIS — I10 Essential (primary) hypertension: Secondary | ICD-10-CM | POA: Insufficient documentation

## 2011-05-31 DIAGNOSIS — I2510 Atherosclerotic heart disease of native coronary artery without angina pectoris: Secondary | ICD-10-CM | POA: Insufficient documentation

## 2011-05-31 DIAGNOSIS — I69998 Other sequelae following unspecified cerebrovascular disease: Secondary | ICD-10-CM | POA: Insufficient documentation

## 2011-05-31 DIAGNOSIS — I252 Old myocardial infarction: Secondary | ICD-10-CM | POA: Insufficient documentation

## 2011-05-31 NOTE — Patient Instructions (Signed)
Facet Block A facet block is an injection procedure used to numb nerves near a spinal joint (facet). The injection usually includes a medicine like Novacaine (anesthetic) and a steroid medicine (similar to cortisone). The injections are made directly into the facet joint of the back. They are used for patients with several types of neck or back pain problems (such as worsening arthritis or persistent pain after surgery) that have not been helped with anti-inflammatory medications, exercise programs, physical therapy, and other forms of pain management. Multiple injections may be needed depending on how many joints are involved.  A facet block procedure can be helpful with diagnosis as well as providing therapeutic pain relief. One of three things may happen after the procedure:  The pain does not go away. This can mean that the pain is probably not coming from blocked facet joints. This information is helpful with diagnosis.   The pain goes away and stays away for a few hours but the original pain comes back and does not get better again. This information is also helpful with diagnosis. It can mean that pain is probably coming from the joints; but the steroid was not helpful for longer term pain control.   The pain goes away after the block, then returns later that day, and then gets better again over the next few days. This can mean that the block was helpful for pain control and the steroid had a longer lasting effect.  If there is good, lasting benefit from the injections, the block may be repeated from 3 to 5 times. If there is good relief but it is only of short-term benefit, other procedures (such as radiofrequency lesioning) may be considered.  Note: The procedure cannot be performed if you have an active infection, a lesion on or near the area of injection, flu, cold, fever, very high blood pressure or if you are on blood thinners. Please make your doctor aware of any of these conditions. This is  for your safety!  LET YOUR CAREGIVER KNOW ABOUT:   Allergies.   Medications taken including herbs, eye drops, over the counter medications, and creams   Use of steroids (by mouth or creams).   Possible pregnancy, if applicable.   Previous problems with anesthetics or Novocaine.   History of blood clots.   History of bleeding or blood problems.   Previous surgery, particularly of the neck and/or back   Other health problems.  RISKS AND COMPLICATIONS These are very uncommon but include:  Bleeding.   Injury to a nerve near the injection site.   Weakness or numbness in areas controlled by nerves near the injection site.   Infection.   Pain at the site of the injection.   Temporary fluid retention in those who are prone to this problem.   Allergic reaction to anesthetics or medicines used during the procedure.  Diabetics may have a temporary increase in their blood sugar after any surgical procedure, especially if steroids are used. Stinging/burning of the numbing medicine is the most uncomfortable part of the procedure; however every person's response to any procedure is individual.  BEFORE THE PROCEDURE   Your caregiver will provide instructions about stopping any medication before the procedure.   Unless advised otherwise, if the injections are in your neck, you may take your medications as usual with a sip of water but do not eat or drink for 6 hours before the procedure.   Unless advised otherwise, you may eat, drink and take your medications as   usual on the day of the procedure (both before and after) if the injections are to be in your lower back.   There is no other specific preparation necessary unless advised otherwise.  PROCEDURE After checking your blood pressure, the procedure will be done in the x-ray (fluoroscopy) room while lying on your stomach. For procedures in the neck, an intravenous line is usually started. The back is then cleansed with an antiseptic  soap. Sterile drapes are placed in this area. The skin is numbed with a local anesthetic. This is felt as a stinging or burning sensation. Using x-ray guidance, needles are then advanced to the appropriate locations. Once the needles are in the proper location, the anesthetic and steroid is injected through the needles and the needles are removed. The skin is then cleansed and bandages are applied. Blood pressure will be checked again, and you will be discharged to leave with your ride after your caregiver says it is okay to go.  AFTER THE PROCEDURE  You may not drive for the remainder of the day after your procedure. An adult must be present to drive you home or to go with you in a taxi or on public transportation. The procedure will be canceled if you do not have a responsible adult with you! This is for your safety.  HOME CARE INSTRUCTIONS   The bandages noted above can be removed on the morning after the procedure.   Resume medications according to your caregiver's instructions.   No heat is to be used near or over the injected area(s) for the remainder of the day.   No tub bath or soaking in water (such as a pool, jacuzzi, etc.) for the remainder of the day.   Some local tenderness may be experienced for a couple of days after the injection. Using an ice pack three or four times a day will help this.   Keep track of the amount of pain relief as well as how long the pain relief lasted.  SEEK MEDICAL CARE IF:   There is drainage from the injection site.   Pain is not controlled with medications prescribed.   There is significant bleeding or swelling.  SEEK IMMEDIATE MEDICAL CARE IF:   You develop a fever of 101 F (38.3 C) or greater.   Worsening pain, swelling, and/or red streaking develops in the skin around the injection site.   Severe pain develops and cannot be controlled with medications prescribed.   You develop any headache, stiff neck, nausea, vomiting, or your eyes become  very sensitive to light.   Weakness or paralysis develops in arms or legs not present before the procedure.   You develop difficulty urinating or difficulty breathing.  Document Released: 07/21/2006 Document Revised: 02/18/2011 Document Reviewed: 07/11/2008 [employer] Patient Information 2012 [employer]. 

## 2011-05-31 NOTE — Progress Notes (Signed)
Right lumbar L2, L3, L4 medial branch blocks and L5 dorsal ramus injection under fluoroscopic guidance  Indication: Right Lumbar pain which is not relieved by medication management or other conservative care and interfering with self-care and mobility.  Informed consent was obtained after describing risks and benefits of the procedure with the patient, this includes bleeding, bruising, infection, paralysis and medication side effects. The patient wishes to proceed and has given written consent. The patient was placed in a prone position. The lumbar area was marked and prepped with Betadine. One ML of 1% lidocaine was injected into each of 3 areas into the skin and subcutaneous tissue. Then a 22-gauge 3.5 spinal needle was inserted targeting the junction of the Right S1 superior articular process and sacral ala junction. Needle was advanced under fluoroscopic guidance. Bone contact was made. Omnipaque 180 was injected x0.5 mL demonstrating no intravascular uptake. Then a solution containing one ML of 4 mg per mL dexamethasone and 3 mL of 2% MPF lidocaine was injected x0.5 mL. Then the Right L5 superior articular process in transverse process junction was targeted. Bone contact was made. Omnipaque 180 was injected x0.5 mL demonstrating no intravascular uptake. Then a solution containing one ML of 4 mg per mL dexamethasone and 3 mL of 2% MPF lidocaine was injected x0.5 mL. Then the Right L4 superior articular process in transverse process junction was targeted. Bone contact was made. Omnipaque 180 was injected x0.5 mL demonstrating no intravascular uptake. Then a solution containing one ML of 4 mg per mL dexamethasone and 3 mL of 2% MPF lidocaine was injected x0.5 mL Patient tolerated procedure well. Post procedure instructions were given. Please refer to post procedure form.

## 2011-05-31 NOTE — Progress Notes (Signed)
  PROCEDURE RECORD [facility]   Name: Derek Sherman DOB:05-28-1960 MRN: [id number]  Date:05/31/2011  Physician: Claudette Laws, MD    Nurse/CMA: Noralyn Pick, CMA  Allergies: No Known Allergies  Consent Signed: yes  Is patient diabetic? no   Pregnant: no LMP: No LMP for male patient. (age 50-55)  Anticoagulants: no Anti-inflammatory: no Antibiotics: no  Procedure: Medial Branch Block  Position: Prone Start Time: 12:34pm  End Time: 12:41pm  Fluoro Time: 16 seconds  RN/CMA Noralyn Pick, CMA Noralyn Pick, CMA    Time 12:18pm 12:43pm    BP 153/76 137/76    Pulse 63 58    Respirations 16 16    O2 Sat 98 98    S/S 6 6    Pain Level 9/10 7/10     D/C home with Mrs. Tabet, patient A & O X 3, D/C instructions reviewed, and sits independently.

## 2011-06-08 ENCOUNTER — Telehealth: Payer: Self-pay | Admitting: Physical Medicine and Rehabilitation

## 2011-06-08 MED ORDER — HYDROCODONE-ACETAMINOPHEN 10-325 MG PO TABS: 1.0000 | ORAL_TABLET | Freq: Four times a day (QID) | ORAL | Status: DC

## 2011-06-08 NOTE — Telephone Encounter (Signed)
Refill on Hydrocodone 

## 2011-06-08 NOTE — Telephone Encounter (Signed)
Rx called into [facility], [phone]. Pt aware.

## 2011-06-23 ENCOUNTER — Encounter: Payer: Self-pay | Admitting: Physical Medicine and Rehabilitation

## 2011-06-23 ENCOUNTER — Encounter
Payer: Worker's Compensation | Attending: Physical Medicine and Rehabilitation | Admitting: Physical Medicine and Rehabilitation

## 2011-06-23 VITALS — BP 125/62 | HR 75 | Resp 16 | Ht 70.0 in | Wt 212.0 lb

## 2011-06-23 DIAGNOSIS — M961 Postlaminectomy syndrome, not elsewhere classified: Secondary | ICD-10-CM

## 2011-06-23 DIAGNOSIS — Z79899 Other long term (current) drug therapy: Secondary | ICD-10-CM | POA: Insufficient documentation

## 2011-06-23 DIAGNOSIS — M545 Low back pain, unspecified: Secondary | ICD-10-CM | POA: Insufficient documentation

## 2011-06-23 DIAGNOSIS — Z9689 Presence of other specified functional implants: Secondary | ICD-10-CM | POA: Insufficient documentation

## 2011-06-23 DIAGNOSIS — G8929 Other chronic pain: Secondary | ICD-10-CM

## 2011-06-23 DIAGNOSIS — M47817 Spondylosis without myelopathy or radiculopathy, lumbosacral region: Secondary | ICD-10-CM

## 2011-06-23 NOTE — Patient Instructions (Signed)
Please make sure you keep your medications locked up in a secure location. Take your medications as prescribed.  I will see you back in 6 weeks.  You have approximately a 2 week supply of your medications today. Please call for a refill in one week.

## 2011-06-23 NOTE — Progress Notes (Signed)
Subjective:    Patient ID: Derek Sherman, male    DOB: 1960-09-20, 51 y.o.   MRN: [id number]     HPI Mr. Mustafa Potts is a pleasant 51 year old gentleman who is followed  here at the [facility] for chronic low  back complaints. He has a worker's compensation injury, status post an  L5-S1 DePuy total disk replacement which was complicated by chronic low  back pain and neurogenic claudication.    He also has hip flexion, knee flexion contractures.    His medical problems include history of severe  hypertension which is managed by his primary care doctor, Dr. Excell Seltzer.  He has history of coronary artery disease, status post myocardial  infarction in 2009 and stroke in 2006 with residual left upper extremity  incoordination of the hand.    He is back in today.  He is s/p:  05/31/2011 Right lumbar L2, L3, L4 medial branch blocks and L5 dorsal ramus injection under fluoroscopic guidance, [place]  He reports no persistent relief from these injections. He reports 20 minutes of relief after the injection.    His average pain is between 8 and 9 on a scale of  10.   He reports constant low back pain, sharp, stabbing, aching in  nature.   He indicates his pain interferes almost completely with general  activity, relation with other, enjoyment of life.    Pain is worse during the day, evening and night. Sleep is poor.   Pain  is worse with walking, standing and sitting, improves with rest and  medication. He reports essentially no relief with current medications.       Pain Inventory Average Pain 9 Pain Right Now 9 My pain is constant and stabbing  In the last 24 hours, has pain interfered with the following? General activity 9 Relation with others 9 Enjoyment of life 9 What TIME of day is your pain at its worst? All Day Sleep (in general) Poor  Pain is worse with: walking, sitting and standing Pain improves with: rest and  medication Relief from Meds: 2  Mobility walk without assistance how many minutes can you walk? 5 ability to climb steps?  yes do you drive?  yes transfers alone Do you have any goals in this area?  yes  Function disabled: date disabled 08/2004 Do you have any goals in this area?  yes  Neuro/Psych depression  Prior Studies Any changes since last visit?  no  Physicians involved in your care Any changes since last visit?  no  Review of Systems  Constitutional: Negative.   HENT: Negative.   Eyes: Negative.   Respiratory: Negative.   Cardiovascular: Negative.   Gastrointestinal: Negative.   Genitourinary: Negative.   Musculoskeletal: Negative.   Skin: Negative.   Neurological: Negative.   Hematological: Negative.   Psychiatric/Behavioral: Negative.        Objective:   Physical Exam Well-  developed and well-nourished gentleman who does not appear in any  distress today. He is oriented x3. Speech is clear. His affect is  bright. He is alert, cooperative and pleasant. Follows commands  without difficulty. Answers my questions appropriately.   Cranial nerves  are intact.   Coordination is diminished in the left hand. Coordination appears to be intact in larger muscle groups of left upper extremity. Finger-nose-finger is performed adequately on left side. Difficulty with small movements in left hand as noted   His reflexes are brisk in the upper extremities, Hoffmann negative on  left. are 2+ at patellar and Achilles tendon bilaterally. No clonus is noted.  No abnormal tone is noted in the lower extremities.   Motor strength is  good 5/5 at hip flexors, knee extensors, dorsiflexors, plantar flexors. He has decreased strength of left grip and intrinsics.    Transitions from sitting to standing without difficulty today. Tandem  gait is performed relatively well. Romberg test is normal.    Musculoskeletal exam reveals decreased range of motion in hips as well  as  knees bilaterally and limited lumbar flexion is noted also. He  complains of some pain with extension. He has limited lumbar extension. Palpation at the lumbosacral junction or throughout the lumbar spine were lower thoracic spine does not provoke pain. Pain is noted with flexion and extension type activities.        Assessment & Plan:  1. Chronic low back pain, status post L5-S1 DePuy total disk  replacement by Dr. Kathlen Brunswick June 2008.   Radiographs from November 20, 2010, did not show listhesis but did show an T12 compression  fracture as well as facet arthropathy at L4-5 and L5-S1. he is also status post:  05/31/2011 Right lumbar L2, L3, L4 medial branch blocks and L5 dorsal ramus injection under fluoroscopic guidance, Kirsteins.  Little relief noted with right L2-3 for medial branch block and a L5 dorsal ramus injection.  2. Recent cervical spine surgery, Dr. Orlie Pollen June 25, 2010   3. Neurogenic claudication.  4. Bilateral hip flexion contracture.   Medical problemsinclude history of coronary artery disease, status post  myocardial infarction in 2009, history of stroke in 2006, managed by Dr.  Excell Seltzer.

## 2011-06-24 ENCOUNTER — Encounter: Payer: Self-pay | Admitting: Physical Medicine and Rehabilitation

## 2011-07-01 ENCOUNTER — Encounter: Payer: Worker's Compensation | Attending: Physical Medicine and Rehabilitation

## 2011-07-01 ENCOUNTER — Encounter: Payer: Self-pay | Admitting: Physical Medicine & Rehabilitation

## 2011-07-01 ENCOUNTER — Ambulatory Visit (HOSPITAL_BASED_OUTPATIENT_CLINIC_OR_DEPARTMENT_OTHER): Payer: Worker's Compensation | Admitting: Physical Medicine & Rehabilitation

## 2011-07-01 VITALS — BP 121/76 | HR 58 | Resp 14 | Ht 71.0 in | Wt 213.0 lb

## 2011-07-01 DIAGNOSIS — G8929 Other chronic pain: Secondary | ICD-10-CM | POA: Insufficient documentation

## 2011-07-01 DIAGNOSIS — M545 Low back pain, unspecified: Secondary | ICD-10-CM | POA: Insufficient documentation

## 2011-07-01 DIAGNOSIS — R279 Unspecified lack of coordination: Secondary | ICD-10-CM | POA: Insufficient documentation

## 2011-07-01 DIAGNOSIS — I10 Essential (primary) hypertension: Secondary | ICD-10-CM | POA: Insufficient documentation

## 2011-07-01 DIAGNOSIS — Z9689 Presence of other specified functional implants: Secondary | ICD-10-CM | POA: Insufficient documentation

## 2011-07-01 DIAGNOSIS — I69998 Other sequelae following unspecified cerebrovascular disease: Secondary | ICD-10-CM | POA: Insufficient documentation

## 2011-07-01 DIAGNOSIS — M24559 Contracture, unspecified hip: Secondary | ICD-10-CM | POA: Insufficient documentation

## 2011-07-01 DIAGNOSIS — I252 Old myocardial infarction: Secondary | ICD-10-CM | POA: Insufficient documentation

## 2011-07-01 DIAGNOSIS — I2510 Atherosclerotic heart disease of native coronary artery without angina pectoris: Secondary | ICD-10-CM | POA: Insufficient documentation

## 2011-07-01 NOTE — Patient Instructions (Addendum)
Track back pain for the next several days and report to Dr. Pamelia Hoit

## 2011-07-01 NOTE — Progress Notes (Signed)
Right sacroiliac injection under fluoroscopic guidance  Indication: Right Low back and buttocks pain not relieved by medication management and other conservative care.  Informed consent was obtained after describing risks and benefits of the procedure with the patient, this includes bleeding, bruising, infection, paralysis and medication side effects. The patient wishes to proceed and has given written consent. The patient was placed in a prone position. The lumbar and sacral area was marked and prepped with Betadine. A 25-gauge 1-1/2 inch needle was inserted into the skin and subcutaneous tissue and 1 mL of 1% lidocaine was injected. Then a 25-gauge 3 inch spinal needle was inserted under fluoroscopic guidance into the Right sacroiliac joint. AP and lateral images were utilized. Omnipaque 180x0.5 mL under live fluoroscopy demonstrated no intravascular uptake. Then a solution containing one ML of 40 mg per mL depomedrol and 2 ML of 1% lidocaine MPF was injected x1.5 mL. Patient tolerated the procedure well. Post procedure instructions were given. Please see post procedure form. 

## 2011-07-01 NOTE — Progress Notes (Signed)
  PROCEDURE RECORD [facility]   Name: Kieran Arreguin DOB:04-30-1960 MRN: [id number]  Date:07/01/2011  Physician: Claudette Laws, MD    Nurse/CMA: Kelli Churn, CMA (AAMA)  Allergies: No Known Allergies  Consent Signed: yes  Is patient diabetic? no   Pregnant: no LMP: No LMP for male patient. (age 50-55)  Anticoagulants: yes (Pt is on plavix but has not taken this in 7 days) Anti-inflammatory: no Antibiotics: no  Procedure: SI Injection Right  Position: Prone Start Time: 12:18pm  End Time: 12:20pm  Fluoro Time: 5 seconds  RN/CMA Venissa Nappi Carroll,CMA    Time 11:24 12:22pm    BP 121/76 130/69    Pulse 58 69    Respirations 14 14    O2 Sat 96 99%    S/S 6 6    Pain Level 9 9/10     D/C home with [name] (wife), patient A & O X 3, D/C instructions reviewed, and sits independently.

## 2011-07-02 ENCOUNTER — Telehealth: Payer: Self-pay | Admitting: Physical Medicine and Rehabilitation

## 2011-07-02 NOTE — Telephone Encounter (Signed)
Pt wife aware that he isn't due for a refill until 06/07/11.

## 2011-07-02 NOTE — Telephone Encounter (Signed)
Requesting Hydrocodone refill. WC only accepts from Dr Pamelia Hoit.  [facility] - [phone]

## 2011-07-02 NOTE — Telephone Encounter (Signed)
Pt isn't due for a refill on his Hydrocodone until 06/07/11.

## 2011-07-05 ENCOUNTER — Telehealth: Payer: Self-pay | Admitting: *Deleted

## 2011-07-05 MED ORDER — HYDROCODONE-ACETAMINOPHEN 10-325 MG PO TABS: 1.0000 | ORAL_TABLET | Freq: Four times a day (QID) | ORAL | Status: DC

## 2011-07-05 NOTE — Telephone Encounter (Signed)
Hydrocodone refill. 

## 2011-07-05 NOTE — Telephone Encounter (Signed)
Rx called in, pt aware 

## 2011-07-28 ENCOUNTER — Encounter: Payer: Self-pay | Admitting: Physical Medicine and Rehabilitation

## 2011-07-28 ENCOUNTER — Encounter
Payer: Worker's Compensation | Attending: Physical Medicine and Rehabilitation | Admitting: Physical Medicine and Rehabilitation

## 2011-07-28 VITALS — BP 153/90 | HR 76 | Resp 16 | Ht 71.0 in | Wt 217.0 lb

## 2011-07-28 DIAGNOSIS — M961 Postlaminectomy syndrome, not elsewhere classified: Secondary | ICD-10-CM

## 2011-07-28 DIAGNOSIS — M79609 Pain in unspecified limb: Secondary | ICD-10-CM | POA: Insufficient documentation

## 2011-07-28 DIAGNOSIS — M533 Sacrococcygeal disorders, not elsewhere classified: Secondary | ICD-10-CM

## 2011-07-28 DIAGNOSIS — I739 Peripheral vascular disease, unspecified: Secondary | ICD-10-CM | POA: Insufficient documentation

## 2011-07-28 DIAGNOSIS — M545 Low back pain, unspecified: Secondary | ICD-10-CM | POA: Insufficient documentation

## 2011-07-28 DIAGNOSIS — M24559 Contracture, unspecified hip: Secondary | ICD-10-CM | POA: Insufficient documentation

## 2011-07-28 DIAGNOSIS — M47817 Spondylosis without myelopathy or radiculopathy, lumbosacral region: Secondary | ICD-10-CM

## 2011-07-28 DIAGNOSIS — G8929 Other chronic pain: Secondary | ICD-10-CM

## 2011-07-28 MED ORDER — HYDROCODONE-ACETAMINOPHEN 10-325 MG PO TABS: 1.0000 | ORAL_TABLET | Freq: Four times a day (QID) | ORAL | Status: AC | PRN

## 2011-07-28 MED ORDER — HYDROCODONE-ACETAMINOPHEN 10-325 MG PO TABS: 1.0000 | ORAL_TABLET | Freq: Four times a day (QID) | ORAL | Status: DC

## 2011-07-28 NOTE — Progress Notes (Signed)
Subjective:    Patient ID: Derek Sherman, male    DOB: 11-Aug-1960, 51 y.o.   MRN: [id number]  HPI   Patient is a 51 year old down and who has a history of L5-S1 Depuy disc replacement 2006 Dr. Lorie Phenix. He has a history of chronic low back pain. Leg pain is not the main problem. He does occasionally have some leg pain which goes down the back of the legs.  No problems with numbness weakness or bowel or bladder control.  Does have a history of hypertension and had a stroke which affected his left side in 2006.  In 2012 he underwent cervical discectomy and fusion Dr. Orlie Pollen.   Today he is back in for a refill of his pain medications. He has undergone lumbar medial branch block twice over the last couple of months.  Last month he underwent a right sacroiliac joint injection which he reports gave him he percent relief of pain on that side for 1-1/2 weeks. He reports a return of pain to the low back bilaterally again.  He is wondering if he could have repeat injection of both sacroiliac joints.    Pain Inventory Average Pain 10 Pain Right Now 10 My pain is constant, sharp, stabbing and aching  In the last 24 hours, has pain interfered with the following? General activity 10 Relation with others 10 Enjoyment of life 10 What TIME of day is your pain at its worst? morning, daytime, and evening Sleep (in general) Poor  Pain is worse with: walking, sitting and standing Pain improves with: rest and medication Relief from Meds: 4  Mobility walk without assistance Do you have any goals in this area?  no  Function not employed: date last employed  Do you have any goals in this area?  no  Neuro/Psych depression  Prior Studies Any changes since last visit?  no  Physicians involved in your care Any changes since last visit?  no       Review of Systems  Musculoskeletal: Positive for back pain.  Psychiatric/Behavioral: Positive for dysphoric mood.  All other systems  reviewed and are negative.       Objective:   Physical Exam    He is a well-developed, well-nourished gentleman, mildly  obese. He is oriented x3.  NEUROLOGIC: Speech is clear. Affect is bright. He is alert,  cooperative, and pleasant. Follows commands without difficulty, answers  my questions appropriately.   His reflexes and lower extremities are  Diminished.   No abnormal tone, clonus, or tremors are noted.  He is able to  transition easily from sitting to standing.   Gait is not antalgic today.   Heel toe walking is performed adequately but reports back pain increase with toe walking.  Rhomberg with some LU drift not new  Very mild 4 plus/5 on left quad. TA give way weakness on left. Otherwise good strength in lower extremities   He has a very limited lumbar motion with forward flexion and especially  in extension. He complains some discomfort. No tenderness with  palpation in the lumbar spine as noted today.   He does have some mild  as well but internal-external rotation  at the hips does not aggravate him.   Tender over both SI joint  Pain with extension     Assessment & Plan:  1. Lumbago.  2. Status post L5-S1 Depuy total disk replacement.  3. Neurogenic claudication.  4. Bilateral hip flexion contracture.    PLAN: We will refill his Vicodin up  to 4 times per day, #40; and  gabapentin as noted above one p.o. q.8 a.m., noon, 3:00 p.m., 7:00 p.m.,  and 2 at bedtime.    Would like to try to minimize use of narcotic pain medications. He is reported some significant improvement after a sacroiliac joint injection. He is requesting repeat injections to both sacroiliac joints. Like to order repeat injections to to also include left SI joint as well. [name] takes his pain medications as prescribed. No evidence of aberrant behavior, pill counts are appropriate.

## 2011-07-28 NOTE — Patient Instructions (Signed)
Please keep your pain medications locked up and in a safe location.  Return to clinic in one month for refill of pain medications

## 2011-08-25 ENCOUNTER — Encounter: Payer: Self-pay | Admitting: Physical Medicine and Rehabilitation

## 2011-08-25 ENCOUNTER — Encounter
Payer: Worker's Compensation | Attending: Physical Medicine and Rehabilitation | Admitting: Physical Medicine and Rehabilitation

## 2011-08-25 VITALS — BP 160/100 | HR 90 | Resp 14 | Ht 71.0 in | Wt 212.0 lb

## 2011-08-25 DIAGNOSIS — M533 Sacrococcygeal disorders, not elsewhere classified: Secondary | ICD-10-CM | POA: Insufficient documentation

## 2011-08-25 DIAGNOSIS — M545 Low back pain, unspecified: Secondary | ICD-10-CM | POA: Insufficient documentation

## 2011-08-25 DIAGNOSIS — M47817 Spondylosis without myelopathy or radiculopathy, lumbosacral region: Secondary | ICD-10-CM | POA: Insufficient documentation

## 2011-08-25 DIAGNOSIS — M961 Postlaminectomy syndrome, not elsewhere classified: Secondary | ICD-10-CM | POA: Insufficient documentation

## 2011-08-25 DIAGNOSIS — G8929 Other chronic pain: Secondary | ICD-10-CM | POA: Insufficient documentation

## 2011-08-25 MED ORDER — HYDROCODONE-ACETAMINOPHEN 10-325 MG PO TABS: 1.0000 | ORAL_TABLET | Freq: Four times a day (QID) | ORAL | Status: DC | PRN

## 2011-08-25 MED ORDER — GABAPENTIN 300 MG PO CAPS: 300.0000 mg | ORAL_CAPSULE | Freq: Four times a day (QID) | ORAL | Status: DC

## 2011-08-25 NOTE — Patient Instructions (Addendum)
Your B/P was running high in our clinic today.  Please follow up with your primary care doctor.    Patient reports he was in the emergency room last night with his daughter.  He has not had a chance to take his blood pressure medication yet.  Follow back up in our clinic in one month for refill of your pain medication  Please take your medication as prescribed.  Please make sure you're pain medications locked up and in a secure location.  Bring your pain medication bottles to each visit

## 2011-08-25 NOTE — Progress Notes (Signed)
Subjective:    Patient ID: Derek Sherman, male    DOB: [birth year], 51 y.o.   MRN: [id number]  HPI Patient is a 51 year old down and who has a history of L5-S1 Depuy disc replacement 2006 Dr. Lorie Phenix. He has a history of chronic low back pain. Leg pain is not the main problem. He does occasionally have some leg pain which goes down the back of the legs.  No problems with numbness weakness or bowel or bladder control.  Does have a history of hypertension and had a stroke which affected his left side in 2006.  In 2012 he underwent cervical discectomy and fusion Dr. Orlie Pollen.  Today he is back in for a refill of his pain medications. He has undergone lumbar medial branch block twice over the last couple of months.  2 months ago he underwent a right sacroiliac joint injection which he reports gave him  relief of pain on that side for 1-1/2 weeks. He reports a return of pain to the low back bilaterally again.  He is wondering if he could have repeat injection of both sacroiliac joints.          Pain Inventory Average Pain 10 Pain Right Now 10 My pain is intermittent  In the last 24 hours, has pain interfered with the following? General activity 10 Relation with others 10 Enjoyment of life 10 What TIME of day is your pain at its worst? morning Sleep (in general) Fair  Pain is worse with: walking, sitting and standing Pain improves with: rest and medication Relief from Meds: 5  Mobility walk without assistance ability to climb steps?  yes do you drive?  yes transfers alone Do you have any goals in this area?  yes  Function disabled: date disabled 2006 Do you have any goals in this area?  yes  Neuro/Psych depression  Prior Studies Any changes since last visit?  no  Physicians involved in your care Any changes since last visit?  no   Family History  Problem Relation Age of Onset  . Hypertension Mother   . Hypertension Father    History   Social History  . Marital  Status: Married    Spouse Name: N/A    Number of Children: N/A  . Years of Education: N/A   Social History Main Topics  . Smoking status: Current Everyday Smoker  . Smokeless tobacco: None  . Alcohol Use: None  . Drug Use: None  . Sexually Active: None   Other Topics Concern  . None   Social History Narrative  . None   Past Surgical History  Procedure Date  . Spine surgery   . Coronary artery bypass graft   . Tonsilectomy, adenoidectomy, bilateral myringotomy and tubes    Past Medical History  Diagnosis Date  . Depression   . Hyperlipidemia   . Hypertension    BP 160/100  Pulse 90  Resp 14  Ht 5\' 11"  (1.803 m)  Wt 212 lb (96.163 kg)  BMI 29.57 kg/m2  SpO2 97%     Review of Systems  Psychiatric/Behavioral: Positive for dysphoric mood.  All other systems reviewed and are negative.       Objective:   Physical Exam He is a well-developed, well-nourished gentleman, mildly  obese. He is oriented x3.  NEUROLOGIC: Speech is clear. Affect is bright. He is alert,  cooperative, and pleasant. Follows commands without difficulty, answers  my questions appropriately.  His reflexes and lower extremities are  Diminished.  No abnormal tone,  clonus, or tremors are noted.  He is able to  transition easily from sitting to standing.  Gait is not antalgic today.  Heel toe walking is performed adequately but reports back pain increase with toe walking.  Rhomberg with some LU drift not new  Very mild 4 plus/5 on left quad. TA give way weakness on left. Otherwise good strength in lower extremities  He has a very limited lumbar motion with forward flexion and especially  in extension. He complains some discomfort. No tenderness with  palpation in the lumbar spine as noted today.    Tender over both SI joint   Pain with extension of lumbar spine        Assessment & Plan:  1. Lumbago.  2. Status post L5-S1 Depuy total disk replacement.  3. Neurogenic claudication.    4. Bilateral hip flexion contracture.    PLAN: We will refill his Vicodin up to 4 times per day, #40; and  gabapentin as noted above one p.o. q.8 a.m., noon, 3:00 p.m., 7:00 p.m.,  and 2 at bedtime.    Would like to try to minimize use of narcotic pain medications. He is reported some significant improvement after a sacroiliac joint injection. He is requesting repeat injections to both sacroiliac joints. Like to order repeat injections to to also include left SI joint as well.  Mr. Omahoney takes his pain medications as prescribed. No evidence of aberrant behavior, pill counts are appropriate.

## 2011-09-22 ENCOUNTER — Encounter: Payer: Self-pay | Admitting: Physical Medicine and Rehabilitation

## 2011-09-22 ENCOUNTER — Encounter
Payer: Worker's Compensation | Attending: Physical Medicine and Rehabilitation | Admitting: Physical Medicine and Rehabilitation

## 2011-09-22 VITALS — BP 156/84 | HR 76 | Resp 16 | Ht 71.0 in | Wt 215.4 lb

## 2011-09-22 DIAGNOSIS — M545 Low back pain, unspecified: Secondary | ICD-10-CM | POA: Insufficient documentation

## 2011-09-22 DIAGNOSIS — I739 Peripheral vascular disease, unspecified: Secondary | ICD-10-CM | POA: Insufficient documentation

## 2011-09-22 DIAGNOSIS — M79605 Pain in left leg: Secondary | ICD-10-CM

## 2011-09-22 DIAGNOSIS — M47817 Spondylosis without myelopathy or radiculopathy, lumbosacral region: Secondary | ICD-10-CM

## 2011-09-22 DIAGNOSIS — R209 Unspecified disturbances of skin sensation: Secondary | ICD-10-CM | POA: Insufficient documentation

## 2011-09-22 DIAGNOSIS — M24559 Contracture, unspecified hip: Secondary | ICD-10-CM | POA: Insufficient documentation

## 2011-09-22 DIAGNOSIS — M79609 Pain in unspecified limb: Secondary | ICD-10-CM | POA: Insufficient documentation

## 2011-09-22 DIAGNOSIS — M47816 Spondylosis without myelopathy or radiculopathy, lumbar region: Secondary | ICD-10-CM

## 2011-09-22 DIAGNOSIS — M961 Postlaminectomy syndrome, not elsewhere classified: Secondary | ICD-10-CM

## 2011-09-22 DIAGNOSIS — G8929 Other chronic pain: Secondary | ICD-10-CM | POA: Insufficient documentation

## 2011-09-22 MED ORDER — METHOCARBAMOL 500 MG PO TABS: 500.0000 mg | ORAL_TABLET | Freq: Two times a day (BID) | ORAL | Status: DC

## 2011-09-22 MED ORDER — HYDROCODONE-ACETAMINOPHEN 10-325 MG PO TABS
1.0000 | ORAL_TABLET | Freq: Four times a day (QID) | ORAL | Status: DC | PRN
Start: 2011-09-22 — End: 2011-10-22

## 2011-09-22 NOTE — Progress Notes (Signed)
Subjective:    Patient ID: Derek Sherman, male    DOB: 16-Sep-1960, 51 y.o.   MRN: [id number]  HPI The patient complains about chronic low back pain which radiates into the left posterior LE to the sole of his feet. The patient also complains about numbness and tingling in the same distribution. The problem has been stable. The patient has a history of  disc replacement at L5-S1 in 2006, and also a history of the CV a in 2006 . Pain Inventory Average Pain 10 Pain Right Now 10 My pain is constant, sharp and stabbing  In the last 24 hours, has pain interfered with the following? General activity 10 Relation with others 10 Enjoyment of life 10 What TIME of day is your pain at its worst? all of the time Sleep (in general) Poor  Pain is worse with: walking, sitting and standing Pain improves with: rest and medication Relief from Meds: 3  Mobility walk without assistance ability to climb steps?  yes do you drive?  yes  Function disabled: date disabled 2006  Neuro/Psych depression  Prior Studies Any changes since last visit?  no  Physicians involved in your care Any changes since last visit?  no   Family History  Problem Relation Age of Onset  . Hypertension Mother   . Hypertension Father    History   Social History  . Marital Status: Married    Spouse Name: N/A    Number of Children: N/A  . Years of Education: N/A   Social History Main Topics  . Smoking status: Current Everyday Smoker -- 28 years    Types: Cigarettes  . Smokeless tobacco: Never Used   Comment: down to 2 cigarettes per day  . Alcohol Use: None  . Drug Use: None  . Sexually Active: None   Other Topics Concern  . None   Social History Narrative  . None   Past Surgical History  Procedure Date  . Spine surgery   . Coronary artery bypass graft   . Tonsilectomy, adenoidectomy, bilateral myringotomy and tubes    Past Medical History  Diagnosis Date  . Depression   . Hyperlipidemia   .  Hypertension    BP 156/84  Pulse 76  Resp 16  Ht 5\' 11"  (1.803 m)  Wt 215 lb 6.4 oz (97.705 kg)  BMI 30.04 kg/m2  SpO2 96%    Review of Systems  Musculoskeletal: Positive for back pain.  Psychiatric/Behavioral: Positive for dysphoric mood.  All other systems reviewed and are negative.       Objective:   Physical Exam  Constitutional: He is oriented to person, place, and time. He appears well-developed and well-nourished.  Neck: Neck supple.  Musculoskeletal: He exhibits tenderness.  Neurological: He is alert and oriented to person, place, and time.  Skin: Skin is warm and dry.  Psychiatric: He has a normal mood and affect.    Symmetric normal motor tone is noted throughout. Normal muscle bulk. Muscle testing reveals 5/5 muscle strength of the upper extremity, and 5/5 of the lower extremity on the right, 4+ on the left throughout. Full range of motion in upper and lower extremities. ROM of spine is restricted. Fine motor movements are normal in both hands. Sensory is intact and symmetric to light touch, pinprick and proprioception. DTR in the upper and lower extremity are present and symmetric 2+. No clonus is noted.  Patient arises from chair with difficulty. Narrow based gait with flexed L-spine, antalgic gait, normal arm swing  bilateral , able to walk on heels and toes . Tandem walk is stable. No pronator drift. Rhomberg negative.        Assessment & Plan:  1. Lumbago.  2. Status post L5-S1 Depuy total disk replacement.  3. Neurogenic claudication.  4. Bilateral hip flexion contracture.  PLAN: We will refill his Vicodin up to 4 times per day, #120; and  gabapentin as noted above one p.o. q.8 a.m., noon, 3:00 p.m., 7:00 p.m.,  and 2 at bedtime. Refilled Robaxin 500mg ,bid. Showed patient stretching exercises for his iliopsoas bilateral, which is very tide because of his posture. Ordered aquatic PT; ordered bilateral SI injections, patient had 40% relief with his  previous SI injections . The plan was discussed with his nurse case manager Liliane Channel, she agreed to the plan.

## 2011-09-22 NOTE — Patient Instructions (Signed)
Continue with walking program, continue with exercises.

## 2011-09-29 ENCOUNTER — Telehealth: Payer: Self-pay | Admitting: Physical Medicine and Rehabilitation

## 2011-09-29 DIAGNOSIS — M545 Low back pain: Secondary | ICD-10-CM

## 2011-09-29 DIAGNOSIS — M5430 Sciatica, unspecified side: Secondary | ICD-10-CM

## 2011-09-29 DIAGNOSIS — G8929 Other chronic pain: Secondary | ICD-10-CM

## 2011-09-29 NOTE — Telephone Encounter (Signed)
Order is being faxed.

## 2011-09-29 NOTE — Telephone Encounter (Signed)
Please fax Rx for Aquatic Therapy using ref [id number]

## 2011-10-22 ENCOUNTER — Encounter
Payer: Worker's Compensation | Attending: Physical Medicine and Rehabilitation | Admitting: Physical Medicine and Rehabilitation

## 2011-10-22 ENCOUNTER — Encounter: Payer: Self-pay | Admitting: Physical Medicine and Rehabilitation

## 2011-10-22 VITALS — BP 177/94 | HR 89 | Resp 14 | Ht 70.0 in | Wt 213.0 lb

## 2011-10-22 DIAGNOSIS — M961 Postlaminectomy syndrome, not elsewhere classified: Secondary | ICD-10-CM

## 2011-10-22 DIAGNOSIS — Z951 Presence of aortocoronary bypass graft: Secondary | ICD-10-CM | POA: Insufficient documentation

## 2011-10-22 DIAGNOSIS — F17200 Nicotine dependence, unspecified, uncomplicated: Secondary | ICD-10-CM | POA: Insufficient documentation

## 2011-10-22 DIAGNOSIS — G8929 Other chronic pain: Secondary | ICD-10-CM | POA: Insufficient documentation

## 2011-10-22 DIAGNOSIS — M24559 Contracture, unspecified hip: Secondary | ICD-10-CM | POA: Insufficient documentation

## 2011-10-22 DIAGNOSIS — M533 Sacrococcygeal disorders, not elsewhere classified: Secondary | ICD-10-CM

## 2011-10-22 DIAGNOSIS — I10 Essential (primary) hypertension: Secondary | ICD-10-CM | POA: Insufficient documentation

## 2011-10-22 DIAGNOSIS — M545 Low back pain, unspecified: Secondary | ICD-10-CM

## 2011-10-22 DIAGNOSIS — Z9689 Presence of other specified functional implants: Secondary | ICD-10-CM | POA: Insufficient documentation

## 2011-10-22 DIAGNOSIS — R209 Unspecified disturbances of skin sensation: Secondary | ICD-10-CM | POA: Insufficient documentation

## 2011-10-22 DIAGNOSIS — E785 Hyperlipidemia, unspecified: Secondary | ICD-10-CM | POA: Insufficient documentation

## 2011-10-22 DIAGNOSIS — Z8673 Personal history of transient ischemic attack (TIA), and cerebral infarction without residual deficits: Secondary | ICD-10-CM | POA: Insufficient documentation

## 2011-10-22 MED ORDER — HYDROCODONE-ACETAMINOPHEN 10-325 MG PO TABS: 1.0000 | ORAL_TABLET | Freq: Four times a day (QID) | ORAL | Status: DC | PRN

## 2011-10-22 NOTE — Progress Notes (Signed)
Subjective:    Patient ID: Derek Sherman, male    DOB: Oct 17, 1960, 51 y.o.   MRN: [id number]  HPI The patient complains about chronic low back pain which radiates into the left posterior LE to the sole of his feet. The patient also complains about numbness and tingling in the same distribution.  The problem has been stable. The patient has a history of disc replacement at L5-S1 in 2006, and also a history of the CV a in 2006 . He reports that he has had a stent placed into his right femoral artery one week ago, and therefore pushed his aquatic therapy one week back. He will start physical therapy on the 16th.  Pain Inventory Average Pain 7 Pain Right Now 7 My pain is sharp and stabbing  In the last 24 hours, has pain interfered with the following? General activity 10 Relation with others 10 Enjoyment of life 10 What TIME of day is your pain at its worst? all the time Sleep (in general) Poor  Pain is worse with: sitting and standing Pain improves with: rest and medication Relief from Meds: 7  Mobility walk without assistance how many minutes can you walk? 10 ability to climb steps?  yes do you drive?  yes transfers alone Do you have any goals in this area?  yes  Function disabled: date disabled 6/06 Do you have any goals in this area?  yes  Neuro/Psych depression  Prior Studies Any changes since last visit?  no  Physicians involved in your care Any changes since last visit?  no   Family History  Problem Relation Age of Onset  . Hypertension Mother   . Hypertension Father    History   Social History  . Marital Status: Married    Spouse Name: N/A    Number of Children: N/A  . Years of Education: N/A   Social History Main Topics  . Smoking status: Current Everyday Smoker -- 28 years    Types: Cigarettes  . Smokeless tobacco: Never Used   Comment: down to 2 cigarettes per day  . Alcohol Use: None  . Drug Use: None  . Sexually Active: None   Other Topics  Concern  . None   Social History Narrative  . None   Past Surgical History  Procedure Date  . Spine surgery   . Coronary artery bypass graft   . Tonsilectomy, adenoidectomy, bilateral myringotomy and tubes    Past Medical History  Diagnosis Date  . Depression   . Hyperlipidemia   . Hypertension    BP 177/94  Pulse 89  Resp 14  Ht 5\' 10"  (1.778 m)  Wt 213 lb (96.616 kg)  BMI 30.56 kg/m2  SpO2 95%     Review of Systems  Musculoskeletal: Positive for back pain.  Psychiatric/Behavioral: Positive for dysphoric mood.  All other systems reviewed and are negative.       Objective:   Physical Exam Constitutional: He is oriented to person, place, and time. He appears well-developed and well-nourished.  Neck: Neck supple.  Musculoskeletal: He exhibits tenderness.  Neurological: He is alert and oriented to person, place, and time.  Skin: Skin is warm and dry.  Psychiatric: He has a normal mood and affect.   Symmetric normal motor tone is noted throughout. Normal muscle bulk. Muscle testing reveals 5/5 muscle strength of the upper extremity, and 5/5 of the lower extremity on the right, 4+ on the left throughout. Full range of motion in upper and lower extremities.  ROM of spine is restricted. Fine motor movements are normal in both hands.  Sensory is intact and symmetric to light touch, pinprick and proprioception.  DTR in the upper and lower extremity are present and symmetric 2+. No clonus is noted.  Patient arises from chair with difficulty. Narrow based gait with flexed L-spine, antalgic gait, normal arm swing bilateral , able to walk on heels and toes . Tandem walk is stable. No pronator drift. Rhomberg negative.        Assessment & Plan:  1. Lumbago.  2. Status post L5-S1 Depuy total disk replacement.  3. Neurogenic claudication.  4. Bilateral hip flexion contracture.  5. Stent placed into his right femoral artery last week. PLAN: We will refill his Vicodin up to  4 times per day, #120;  Showed patient stretching exercises for his iliopsoas bilateral, which is very tide because of his posture.  Will start aquatic PT on the 16th; will recieve bilateral SI injections on the 20th, patient had 40% relief with his previous SI injections . The plan was discussed with his nurse case manager Liliane Channel, she agreed to the plan.

## 2011-10-22 NOTE — Patient Instructions (Signed)
Continue with your exercises, start aquatic therapy when surgery incisions are healed.

## 2011-11-02 ENCOUNTER — Encounter: Payer: Worker's Compensation | Attending: Physical Medicine and Rehabilitation

## 2011-11-02 ENCOUNTER — Encounter: Payer: Self-pay | Admitting: Physical Medicine & Rehabilitation

## 2011-11-02 ENCOUNTER — Ambulatory Visit (HOSPITAL_BASED_OUTPATIENT_CLINIC_OR_DEPARTMENT_OTHER): Payer: Worker's Compensation | Admitting: Physical Medicine & Rehabilitation

## 2011-11-02 VITALS — BP 187/90 | HR 75 | Resp 14 | Ht 70.0 in | Wt 211.0 lb

## 2011-11-02 DIAGNOSIS — M545 Low back pain, unspecified: Secondary | ICD-10-CM | POA: Insufficient documentation

## 2011-11-02 DIAGNOSIS — G8929 Other chronic pain: Secondary | ICD-10-CM | POA: Insufficient documentation

## 2011-11-02 DIAGNOSIS — M961 Postlaminectomy syndrome, not elsewhere classified: Secondary | ICD-10-CM | POA: Insufficient documentation

## 2011-11-02 DIAGNOSIS — M47817 Spondylosis without myelopathy or radiculopathy, lumbosacral region: Secondary | ICD-10-CM | POA: Insufficient documentation

## 2011-11-02 DIAGNOSIS — M533 Sacrococcygeal disorders, not elsewhere classified: Secondary | ICD-10-CM

## 2011-11-02 NOTE — Progress Notes (Signed)
Bilateral sacroiliac injections under fluoroscopic guidance  Indication: Low back and buttocks pain not relieved by medication management and other conservative care.  Informed consent was obtained after describing risks and benefits of the procedure with the patient, this includes bleeding, bruising, infection, paralysis and medication side effects. The patient wishes to proceed and has given written consent. The patient was placed in a prone position. The lumbar and sacral area was marked and prepped with Betadine. A 25-gauge 1-1/2 inch needle was inserted into the skin and subcutaneous tissue and 1 mL of 1% lidocaine was injected into each side. Then a 25-gauge 3 inch spinal needle was inserted under fluoroscopic guidance into the left sacroiliac joint. AP and lateral images were utilized. Omnipaque 180x0.5 mL under live fluoroscopy demonstrated no intravascular uptake. Then a solution containing one ML of 40 mg per mL Depakote met drawl in 2 ML of 2% lidocaine MPF was injected x1.5 mL. This same procedure was repeated on the right side using the same needle, injectate, and technique. Patient tolerated the procedure well. Post procedure instructions were given. Please see post procedure form. 

## 2011-11-02 NOTE — Progress Notes (Signed)
  PROCEDURE RECORD [facility]   Name: Derek Sherman DOB:November 06, 1960 MRN: [id number]  Date:11/02/2011  Physician: Claudette Laws, MD    Nurse/CMA: Kelli Churn, CMA  Allergies: No Known Allergies  Consent Signed: yes  Is patient diabetic? no    Pregnant: no LMP: No LMP for male patient. (age 50-55)  Anticoagulants: no Anti-inflammatory: no Antibiotics: no  Procedure:  Bilateral Sacroilliac injection Position: Prone Start Time: 3:10pm  End Time:  3:15pm Fluoro Time: 12  RN/CMA Levens, CMA Levens, CMA    Time 2:35pm 3:17pm    BP 187/90 162/88    Pulse 75 72    Respirations 14 14    O2 Sat 97 97    S/S 6 6    Pain Level 10/10 7/10     D/C home with Wife-[name], patient A & O X 3, D/C instructions reviewed, and sits independently.

## 2011-11-02 NOTE — Patient Instructions (Signed)
Sacroiliac Joint Dysfunction The sacroiliac joint connects the lower part of the spine (the sacrum) with the bones of the pelvis. CAUSES  Sometimes, there is no obvious reason for sacroiliac joint dysfunction. Other times, it may occur   During pregnancy.   After injury, such as:   Car accidents.   Sport-related injuries.   Work-related injuries.   Due to one leg being shorter than the other.   Due to other conditions that affect the joints, such as:   Rheumatoid arthritis.   Gout.   Psoriasis.   Joint infection (septic arthritis).  SYMPTOMS  Symptoms may include:  Pain in the:   Lower back.   Buttocks.   Groin.   Thighs and legs.   Difficult sitting, standing, walking, lying, bending or lifting.  DIAGNOSIS  A number of tests may be used to help diagnose the cause of sacroiliac joint dysfunction, including:  Imaging tests to look for other causes of pain, including:   MRI.   CT scan.   Bone scan.   Diagnostic injection: During a special x-ray (called fluoroscopy), a needle is put into the sacroiliac joint. A numbing medicine is injected into the joint. If the pain is improved or stopped, the diagnosis of sacroiliac joint dysfunction is more likely.  TREATMENT  There are a number of types of treatment used for sacroiliac joint dysfunction, including:  Only take over-the-counter or prescription medicines for pain, discomfort, or fever as directed by your caregiver.   Medications to relax muscles.   Rest. Decreasing activity can help cut down on painful muscle spasms and allow the back to heal.   Application of heat or ice to the lower back may improve muscle spasms and soothe pain.   Brace. A special back brace, called a sacroiliac belt, can help support the joint while your back is healing.   Physical therapy can help teach comfortable positions and exercises to strengthen muscles that support the sacroiliac joint.   Cortisone injections. Injections  of steroid medicine into the joint can help decrease swelling and improve pain.   Hyaluronic acid injections. This chemical improves lubrication within the sacroiliac joint, thereby decreasing pain.   Radiofrequency ablation. A special needle is placed into the joint, where it burns away nerves that are carrying pain messages from the joint.   Surgery. Because pain occurs during movement of the joint, screws and plates may be installed in order to limit or prevent joint motion.  HOME CARE INSTRUCTIONS   Take all medications exactly as directed.   Follow instructions regarding both rest and physical activity, to avoid worsening the pain.   Do physical therapy exercises exactly as prescribed.  SEEK IMMEDIATE MEDICAL CARE IF:  You experience increasingly severe pain.   You develop new symptoms, such as numbness or tingling in your legs or feet.   You lose bladder or bowel control.  Document Released: 05/28/2008 Document Revised: 02/18/2011 Document Reviewed: 05/28/2008 [employer] Patient Information 2012 [employer]. 

## 2011-11-19 ENCOUNTER — Encounter: Payer: Worker's Compensation | Admitting: Physical Medicine and Rehabilitation

## 2011-11-25 ENCOUNTER — Encounter: Payer: Self-pay | Admitting: Physical Medicine and Rehabilitation

## 2011-11-25 ENCOUNTER — Encounter
Payer: Worker's Compensation | Attending: Physical Medicine and Rehabilitation | Admitting: Physical Medicine and Rehabilitation

## 2011-11-25 VITALS — BP 168/83 | HR 73 | Resp 16 | Ht 70.0 in | Wt 208.0 lb

## 2011-11-25 DIAGNOSIS — G8929 Other chronic pain: Secondary | ICD-10-CM

## 2011-11-25 DIAGNOSIS — M545 Low back pain, unspecified: Secondary | ICD-10-CM

## 2011-11-25 DIAGNOSIS — M24559 Contracture, unspecified hip: Secondary | ICD-10-CM | POA: Insufficient documentation

## 2011-11-25 DIAGNOSIS — M961 Postlaminectomy syndrome, not elsewhere classified: Secondary | ICD-10-CM

## 2011-11-25 MED ORDER — HYDROCODONE-ACETAMINOPHEN 10-325 MG PO TABS: 1.0000 | ORAL_TABLET | Freq: Four times a day (QID) | ORAL | Status: DC | PRN

## 2011-11-25 MED ORDER — GABAPENTIN 300 MG PO CAPS: 300.0000 mg | ORAL_CAPSULE | Freq: Four times a day (QID) | ORAL | Status: DC

## 2011-11-25 NOTE — Patient Instructions (Signed)
Continue with aquatic therapy, after those a maintenance program would be very beneficial for further rehabilitation. Continue with your exercises and with your walking program .

## 2011-11-25 NOTE — Progress Notes (Signed)
Subjective:    Patient ID: Derek Sherman, male    DOB: 1960-10-09, 51 y.o.   MRN: [id number] The patient complains about chronic low back pain which radiates into the left posterior LE to the sole of his feet. The patient also complains about numbness and tingling in the same distribution.  The problem has been stable. The patient has a history of disc replacement at L5-S1 in 2006, and also a history of the CV a in 2006 . He reports that he has had a stent placed into his right femoral artery one week ago, and therefore pushed his aquatic therapy one week back. He is doing the aquatic physical therapy, and he states, that this is helping him a lot. He states, that it has increased his functioning, and decreased his pain. He has 5 appointments left.  Back Pain   Pain Inventory Average Pain 10 Pain Right Now 9 My pain is constant, sharp, stabbing and aching  In the last 24 hours, has pain interfered with the following? General activity 10 Relation with others 10 Enjoyment of life 10 What TIME of day is your pain at its worst? All Day Sleep (in general) Poor  Pain is worse with: walking, sitting and standing Pain improves with: rest and medication Relief from Meds: 5  Mobility walk without assistance how many minutes can you walk? 10 ability to climb steps?  yes do you drive?  yes  Function disabled: date disabled 2006  Neuro/Psych depression  Prior Studies Any changes since last visit?  no  Physicians involved in your care Any changes since last visit?  no   Family History  Problem Relation Age of Onset  . Hypertension Mother   . Hypertension Father    History   Social History  . Marital Status: Married    Spouse Name: N/A    Number of Children: N/A  . Years of Education: N/A   Social History Main Topics  . Smoking status: Current Every Day Smoker -- 28 years    Types: Cigarettes  . Smokeless tobacco: Never Used   Comment: down to 2 cigarettes per day  .  Alcohol Use: None  . Drug Use: None  . Sexually Active: None   Other Topics Concern  . None   Social History Narrative  . None   Past Surgical History  Procedure Date  . Spine surgery   . Coronary artery bypass graft   . Tonsilectomy, adenoidectomy, bilateral myringotomy and tubes    Past Medical History  Diagnosis Date  . Depression   . Hyperlipidemia   . Hypertension         Review of Systems  Constitutional: Negative.   HENT: Negative.   Eyes: Negative.   Respiratory: Negative.   Cardiovascular: Negative.   Gastrointestinal: Negative.   Genitourinary: Negative.   Musculoskeletal: Positive for back pain.  Neurological: Negative.   Hematological: Negative.   Psychiatric/Behavioral:       Depression       Objective:   Physical Exam Constitutional: He is oriented to person, place, and time. He appears well-developed and well-nourished.  Neck: Neck supple.  Musculoskeletal: He exhibits tenderness.  Neurological: He is alert and oriented to person, place, and time.  Skin: Skin is warm and dry.  Psychiatric: He has a normal mood and affect.  Symmetric normal motor tone is noted throughout. Normal muscle bulk. Muscle testing reveals 5/5 muscle strength of the upper extremity, and 5/5 of the lower extremity on the right, 4+  on the left throughout. Full range of motion in upper and lower extremities. ROM of spine is restricted. Fine motor movements are normal in both hands.  Sensory is intact and symmetric to light touch, pinprick and proprioception.  DTR in the upper and lower extremity are present and symmetric 2+. No clonus is noted.  Patient arises from chair with difficulty. Narrow based gait with flexed L-spine, antalgic gait, normal arm swing bilateral , able to walk on heels and toes . Tandem walk is stable. No pronator drift. Rhomberg negative.         Assessment & Plan:  1. Lumbago.  2. Status post L5-S1 Depuy total disk replacement.  3. Neurogenic  claudication.  4. Bilateral hip flexion contracture.  5. Stent placed into his right femoral artery last week.  PLAN: We will refill his Vicodin up to 4 times per day, #120; Showed patient stretching exercises for his iliopsoas bilateral, which is very tide because of his posture.  Continue aquatic therapy, after he has finished this, he should do a maintenance program in the [facility] at least 3 month, I wrote a prescription. The exercising in the water will be very beneficial for his further rehabilitation.  The bilateral SI injections on the 20th of August 2013, did not give him any relief.  The plan was discussed with his nurse case manager Liliane Channel, she agreed to the plan. Follow up in 1 month.

## 2011-12-22 ENCOUNTER — Encounter: Payer: Self-pay | Admitting: Physical Medicine and Rehabilitation

## 2011-12-22 ENCOUNTER — Encounter
Payer: Worker's Compensation | Attending: Physical Medicine and Rehabilitation | Admitting: Physical Medicine and Rehabilitation

## 2011-12-22 VITALS — BP 140/82 | HR 80 | Resp 16 | Ht 70.0 in | Wt 214.0 lb

## 2011-12-22 DIAGNOSIS — M545 Low back pain, unspecified: Secondary | ICD-10-CM | POA: Insufficient documentation

## 2011-12-22 DIAGNOSIS — M47817 Spondylosis without myelopathy or radiculopathy, lumbosacral region: Secondary | ICD-10-CM | POA: Insufficient documentation

## 2011-12-22 DIAGNOSIS — G8929 Other chronic pain: Secondary | ICD-10-CM | POA: Insufficient documentation

## 2011-12-22 MED ORDER — HYDROCODONE-ACETAMINOPHEN 10-325 MG PO TABS: 1.0000 | ORAL_TABLET | Freq: Four times a day (QID) | ORAL | Status: DC | PRN

## 2011-12-22 NOTE — Patient Instructions (Addendum)
I have ordered low-back x-rays today.  I have refilled your medication  I am glad to here you are doing better with aquatic therapy.  I will see you back in one month.  We discussed the possibility of considering repeat medial branch blocks today.

## 2011-12-22 NOTE — Progress Notes (Signed)
Subjective:    Patient ID: Derek Sherman, male    DOB: Jun 06, 1960, 51 y.o.   MRN: [id number]  HPI  Patient is a 51 year old down and who has a history of L5-S1 Depuy disc replacement 2006 Dr. Lorie Phenix. He has a history of chronic low back pain. Leg pain is not the main problem. He does occasionally have some leg pain which goes down the back of the legs.  No problems with numbness weakness or bowel or bladder control.  Does have a history of hypertension and had a stroke which affected his left side in 2006.  Mr. Laughter was followed by Dr. Stevphen Rochester in 2009. He had undergone medial branch blocks at that time  however in November of that year he suffered myocardial infarction underwent stenting.  He was treated conservatively after the MI in 2009 with long term medication management.  He had significant problems with blood pressure for many years which made difficult to pursue PT safely.  Eventually had a second stroke early in 2012 with left sided weakness also that  year underwent he underwent cervical discectomy and fusion Dr. Orlie Pollen on June 25, 2010.  His chief complaint is low back. Pain is worse with extension. Pain is not exacerbated by forward flexion. He has occasional right foot pain which seems to improve with gabapentin.  Low back pain is significantly worse than leg pain.  No new problems regarding bowel or bladder  Patient reports slight improvement in overall pain since aquatic therapy has been initiated.  Prior he had been unable to participate in physical therapy due to increased pain.  He has had approximately 17 visits with aquatic therapist.  Prior treatment for low back pain has included medial branch blocks,  Pain Inventory Average Pain 9 Pain Right Now 9 My pain is constant, sharp, stabbing and aching  In the last 24 hours, has pain interfered with the following? General activity 9 Relation with others 9 Enjoyment of life 9 What TIME of day is your pain at  its worst? all the time Sleep (in general) Fair  Pain is worse with: walking, sitting and standing Pain improves with: rest, therapy/exercise and medication Relief from Meds: 2  Mobility walk without assistance how many minutes can you walk? 15 ability to climb steps?  yes do you drive?  yes transfers alone Do you have any goals in this area?  yes  Function disabled: date disabled 06/06 Do you have any goals in this area?  yes  Neuro/Psych depression  Prior Studies water therapy is helping  Physicians involved in your care Any changes since last visit?  no   Family History  Problem Relation Age of Onset  . Hypertension Mother   . Hypertension Father    History   Social History  . Marital Status: Married    Spouse Name: N/A    Number of Children: N/A  . Years of Education: N/A   Social History Main Topics  . Smoking status: Current Every Day Smoker -- 28 years    Types: Cigarettes  . Smokeless tobacco: Never Used   Comment: down to 2 cigarettes per day  . Alcohol Use: None  . Drug Use: None  . Sexually Active: None   Other Topics Concern  . None   Social History Narrative  . None   Past Surgical History  Procedure Date  . Spine surgery   . Coronary artery bypass graft   . Tonsilectomy, adenoidectomy, bilateral myringotomy and tubes    Past  Medical History  Diagnosis Date  . Depression   . Hyperlipidemia   . Hypertension    BP 140/82  Pulse 80  Resp 16  Ht 5\' 10"  (1.778 m)  Wt 214 lb (97.07 kg)  BMI 30.71 kg/m2  SpO2 95%     Review of Systems  Musculoskeletal: Positive for back pain.  Psychiatric/Behavioral: Positive for dysphoric mood.  All other systems reviewed and are negative.       Objective:   Physical Exam  He is a well-developed, well-nourished gentleman, mildly  obese. He is oriented x3.  NEUROLOGIC: Speech is clear. Affect is bright. He is alert,  cooperative, and pleasant. Follows commands without difficulty,  answers  my questions appropriately.  His reflexes and lower extremities are  Intact. No abnormal tone, clonus, or tremors are noted.  He is able to  transition easily from sitting to standing.  Gait is not antalgic today.  Heel toe walking is performed adequately but reports back pain increase with toe walking.  Rhomberg with some LU drift not new  Strength in the lower extremities is as follows: Bilateral hip flexors 5 over 5, bilateral knee extensor 5/over 5, bilateral dorsiflexor 5/over 5, plantar flexors bilaterally 5/5, knee flexors 5/5.  He has a very limited lumbar motion with forward flexion and especially  in extension. He complains some discomfort. No tenderness with  palpation in the lumbar spine as noted today.  Non Tender over both SI joint  Pain with extension of lumbar spine        Assessment & Plan:  1. Lumbago.   2. Status post L5-S1 Depuy total disk replacement.   3.L4-L5 facet arthropathy  4. Remote T12 compression  5. Neurogenic claudication.   6. Bilateral hip flexion contracture.    PLAN:  Will obtain lumbar radiographs to include flexion extension views. Will review and consider repeat bilateral radio frequency lumbar spine medial branch block.   01/16/08 Facet medial branch intervention L5, 4, 3, and 2 right and left side.  03   Radiographs from November 20, 2010, did not show listhesis but did show an age indeterminate T12 compression  fracture as well as facet arthropathy at L4-5 and L5-S1 per report.   Encourage continued use of aquatic therapy transitioning to community facility.    We will refill his Vicodin up to 4 times per day, #40; and  gabapentin as noted above one p.o. q.8 a.m., noon, 3:00 p.m., 7:00 p.m.,  and 2 at bedtime.  Would like to try to minimize use of narcotic pain medications.    Mr. Gomillion takes his pain medications as prescribed. No evidence of aberrant behavior, pill counts are appropriate.

## 2012-01-19 ENCOUNTER — Ambulatory Visit: Payer: Self-pay | Admitting: Physical Medicine and Rehabilitation

## 2012-01-26 ENCOUNTER — Encounter
Payer: Worker's Compensation | Attending: Physical Medicine and Rehabilitation | Admitting: Physical Medicine and Rehabilitation

## 2012-01-26 ENCOUNTER — Encounter: Payer: Self-pay | Admitting: Physical Medicine and Rehabilitation

## 2012-01-26 ENCOUNTER — Ambulatory Visit
Admission: RE | Admit: 2012-01-26 | Discharge: 2012-01-26 | Disposition: A | Discharge status: Home / Self Care | Payer: Worker's Compensation | Source: Ambulatory Visit | Attending: Physical Medicine and Rehabilitation | Admitting: Physical Medicine and Rehabilitation

## 2012-01-26 VITALS — BP 167/87 | HR 85 | Resp 16 | Ht 70.0 in | Wt 214.0 lb

## 2012-01-26 DIAGNOSIS — G8929 Other chronic pain: Secondary | ICD-10-CM

## 2012-01-26 DIAGNOSIS — Z5181 Encounter for therapeutic drug level monitoring: Secondary | ICD-10-CM | POA: Insufficient documentation

## 2012-01-26 DIAGNOSIS — M47817 Spondylosis without myelopathy or radiculopathy, lumbosacral region: Secondary | ICD-10-CM

## 2012-01-26 DIAGNOSIS — M24559 Contracture, unspecified hip: Secondary | ICD-10-CM | POA: Insufficient documentation

## 2012-01-26 DIAGNOSIS — M549 Dorsalgia, unspecified: Secondary | ICD-10-CM | POA: Insufficient documentation

## 2012-01-26 MED ORDER — HYDROCODONE-ACETAMINOPHEN 10-325 MG PO TABS: 1.0000 | ORAL_TABLET | Freq: Four times a day (QID) | ORAL | Status: DC | PRN

## 2012-01-26 NOTE — Patient Instructions (Addendum)
I have ordered 4 more weeks of aquatic physical therapy.  Keep your pain medications locked up and in a secure location     Is take your pain medication as prescribed  Refrain from driving and operating machinery while using his medications as they can cause readiness  I will see you back in one month.

## 2012-01-26 NOTE — Progress Notes (Signed)
Subjective:    Patient ID: Derek Sherman, male    DOB: [birth year], 51 y.o.   MRN: [id number]  HPI  Patient is a 51 year old down and who has a history of L5-S1 Depuy disc replacement 2006 Dr. Lorie Phenix. He has a history of chronic low back pain. Leg pain is not the main problem. He does occasionally have some leg pain which goes down the back of the legs.    No problems with numbness weakness or bowel or bladder control.   Does have a history of hypertension and had a stroke which affected his left side in 2006.   Mr. Kenna was followed by Dr. Stevphen Rochester in 2009. He had undergone medial branch blocks at that time however in November of that year he suffered myocardial infarction underwent stenting.   He was treated conservatively after the MI in 2009 with long term medication management.   He had significant problems with blood pressure for many years which made difficult to pursue PT safely. Eventually had a second stroke early in 2012 with left sided weakness also that year underwent he underwent cervical discectomy and fusion Dr. Orlie Pollen on June 25, 2010.    His chief complaint is low back. Pain is worse with extension. Pain is not exacerbated by forward flexion. He has occasional right foot pain which seems to improve with gabapentin.  Low back pain is significantly worse than leg pain.  No new problems regarding bowel or bladder    Patient reports slight improvement in overall pain since aquatic therapy has been initiated.  Prior he had been unable to participate in physical therapy due to increased pain.  He has had approximately 17 visits with aquatic therapist.    Prior treatment for low back pain has included medial branch blocks and SI injection.    Pain Inventory Average Pain 10 Pain Right Now 10 My pain is constant, sharp, stabbing and aching  In the last 24 hours, has pain interfered with the following? General activity 10 Relation with others 10 Enjoyment of life  10 What TIME of day is your pain at its worst? all the time Sleep (in general) Poor  Pain is worse with: walking, sitting and standing Pain improves with: rest and medication Relief from Meds: 0  Mobility walk without assistance ability to climb steps?  yes do you drive?  yes transfers alone Do you have any goals in this area?  yes  Function disabled: date disabled 08/2004 Do you have any goals in this area?  no  Neuro/Psych depression  Prior Studies Any changes since last visit?  no  Physicians involved in your care Any changes since last visit?  no   Family History  Problem Relation Age of Onset  . Hypertension Mother   . Hypertension Father    History   Social History  . Marital Status: Married    Spouse Name: N/A    Number of Children: N/A  . Years of Education: N/A   Social History Main Topics  . Smoking status: Current Every Day Smoker -- 28 years    Types: Cigarettes  . Smokeless tobacco: Never Used     Comment: down to 2 cigarettes per day  . Alcohol Use: None  . Drug Use: None  . Sexually Active: None   Other Topics Concern  . None   Social History Narrative  . None   Past Surgical History  Procedure Date  . Spine surgery   . Coronary artery bypass graft   .  Tonsilectomy, adenoidectomy, bilateral myringotomy and tubes    Past Medical History  Diagnosis Date  . Depression   . Hyperlipidemia   . Hypertension    BP 167/87  Pulse 85  Resp 16  Ht 5\' 10"  (1.778 m)  Wt 214 lb (97.07 kg)  BMI 30.71 kg/m2  SpO2 96%   Review of Systems  Musculoskeletal: Positive for back pain.  Psychiatric/Behavioral: Positive for dysphoric mood.  All other systems reviewed and are negative.       Objective:   Physical Exam  He is a well-developed, well-nourished gentleman, mildly  obese. He is oriented x3.  NEUROLOGIC: Speech is clear. Affect is bright. He is alert,  cooperative, and pleasant. Follows commands without difficulty, answers  my  questions appropriately.  His reflexes and lower extremities are  Intact.  No abnormal tone, clonus, or tremors are noted.  He is able to  transition easily from sitting to standing.  Gait is not antalgic today.  Heel toe walking is performed adequately but reports back pain increase with toe walking.  Rhomberg with some LU drift not new  Strength in the lower extremities is as follows: Bilateral hip flexors 5 over 5, bilateral knee extensor 5/over 5, bilateral dorsiflexor 5/over 5, plantar flexors bilaterally 5/5, knee flexors 5/5.  He has a very limited lumbar motion with forward flexion and especially  in extension. He complains some discomfort. No tenderness with  palpation in the lumbar spine as noted today.  Non Tender over both SI joint  Pain with extension of lumbar spine        Assessment & Plan:  1. Lumbago.  2. Status post L5-S1 Depuy total disk replacement.  3.L4-L5 facet arthropathy  4. Remote T12 compression  5. Neurogenic claudication.  6. Bilateral hip flexion contracture.  PLAN:   Will obtain lumbar radiographs to include flexion extension views. Will review and consider repeat bilateral radio frequency lumbar spine medial branch block.    01/16/08 Facet medial branch intervention L5, 4, 3, and 2 right and left side. Toward the end of November in 2009 patient suffered MI and underwent stenting followed by cardiac rehabilitation. Facet medial branch block intervention may not have been assessed well at that time due to intervening health issues.  Discussion today considering repeat medial branch blocks.  Patient would like to start physical therapy again using the pool. Will have further discussion at next visit regarding medial branch blocks  Radiographs from November 20, 2010, did not show listhesis but did show an age indeterminate T12 compression  fracture as well as facet arthropathy at L4-5 and L5-S1 per report.  Encourage continued use of aquatic therapy  transitioning to community facility.   Will refill Vicodin. Gabapentin as noted above one p.o. q.8 a.m., noon, 3:00 p.m., 7:00 p.m.,  and 2 at bedtime.  Would like to try to minimize use of narcotic pain medications.  Mr. Comella takes his pain medications as prescribed. No evidence of aberrant behavior, pill counts are appropriate. No evidence of HI/SI.  Right sided SI injection gave 1 1/2 weeks of relief, bilat SI essentially no relief.  Pt is looking forward to pool therapy. He stated he has appt on the 15th.  lwill Order for more weeks of pool therapy today.

## 2012-02-23 ENCOUNTER — Encounter: Payer: Self-pay | Admitting: Physical Medicine and Rehabilitation

## 2012-02-23 ENCOUNTER — Encounter
Payer: Worker's Compensation | Attending: Physical Medicine and Rehabilitation | Admitting: Physical Medicine and Rehabilitation

## 2012-02-23 VITALS — BP 162/100 | HR 80 | Resp 14 | Ht 70.0 in | Wt 211.0 lb

## 2012-02-23 DIAGNOSIS — M545 Low back pain, unspecified: Secondary | ICD-10-CM | POA: Insufficient documentation

## 2012-02-23 DIAGNOSIS — M79609 Pain in unspecified limb: Secondary | ICD-10-CM | POA: Insufficient documentation

## 2012-02-23 DIAGNOSIS — Z5181 Encounter for therapeutic drug level monitoring: Secondary | ICD-10-CM

## 2012-02-23 DIAGNOSIS — G8929 Other chronic pain: Secondary | ICD-10-CM | POA: Insufficient documentation

## 2012-02-23 DIAGNOSIS — R209 Unspecified disturbances of skin sensation: Secondary | ICD-10-CM | POA: Insufficient documentation

## 2012-02-23 DIAGNOSIS — M24559 Contracture, unspecified hip: Secondary | ICD-10-CM | POA: Insufficient documentation

## 2012-02-23 DIAGNOSIS — I739 Peripheral vascular disease, unspecified: Secondary | ICD-10-CM | POA: Insufficient documentation

## 2012-02-23 MED ORDER — HYDROCODONE-ACETAMINOPHEN 10-325 MG PO TABS: 1.0000 | ORAL_TABLET | Freq: Four times a day (QID) | ORAL | Status: DC | PRN

## 2012-02-23 NOTE — Progress Notes (Signed)
Subjective:    Patient ID: Derek Sherman, male    DOB: 1961/01/02, 51 y.o.   MRN: [id number]  HPI Patient is a 51 year old down and who has a history of L5-S1 Depuy disc replacement 2006 Dr. Lorie Phenix. He has a history of chronic low back pain. Leg pain is not the main problem. He does occasionally have some leg pain which goes down the back of the legs.   No problems with numbness weakness or bowel or bladder control.   Does have a history of hypertension and had a stroke which affected his left side in 2006.   Derek Sherman was followed by Dr. Stevphen Rochester in 2009. He had undergone medial branch blocks at that time however in November of that year he suffered myocardial infarction underwent stenting.  He was treated conservatively after the MI in 2009 with long term medication management.  He had significant problems with blood pressure for many years which made difficult to pursue PT safely. Eventually had a second stroke early in 2012 with left sided weakness also that year underwent he underwent cervical discectomy and fusion Dr. Orlie Pollen on June 25, 2010.   His chief complaint is low back. Pain is worse with extension. Pain is not exacerbated by forward flexion. He has occasional right foot pain which seems to improve with gabapentin.  Low back pain is significantly worse than leg pain.  No new problems regarding bowel or bladder    Patient reports  improvement in overall pain since aquatic therapy has been initiated.  Prior he had been unable to participate in physical therapy due to increased pain.   Prior treatment for low back pain has included medial branch blocks and SI injection.   He is here today or a refill of his pain medication and medication monitoring.    Pain Inventory Average Pain 8 Pain Right Now 8 My pain is sharp, stabbing and aching  In the last 24 hours, has pain interfered with the following? General activity 8 Relation with others 8 Enjoyment of life 8 What TIME  of day is your pain at its worst? all the time Sleep (in general) Poor  Pain is worse with: walking, sitting and standing Pain improves with: rest, therapy/exercise and medication Relief from Meds: 0  Mobility walk without assistance how many minutes can you walk? 10 ability to climb steps?  yes do you drive?  yes Do you have any goals in this area?  yes  Function disabled: date disabled 6/06 Do you have any goals in this area?  yes  Neuro/Psych No problems in this area  Prior Studies Any changes since last visit?  no  Physicians involved in your care Any changes since last visit?  no   Family History  Problem Relation Age of Onset  . Hypertension Mother   . Hypertension Father    History   Social History  . Marital Status: Married    Spouse Name: N/A    Number of Children: N/A  . Years of Education: N/A   Social History Main Topics  . Smoking status: Current Every Day Smoker -- 28 years    Types: Cigarettes  . Smokeless tobacco: Never Used     Comment: down to 2 cigarettes per day  . Alcohol Use: None  . Drug Use: None  . Sexually Active: None   Other Topics Concern  . None   Social History Narrative  . None   Past Surgical History  Procedure Date  . Spine surgery   .  Coronary artery bypass graft   . Tonsilectomy, adenoidectomy, bilateral myringotomy and tubes    Past Medical History  Diagnosis Date  . Depression   . Hyperlipidemia   . Hypertension    BP 162/100  Pulse 80  Resp 14  Ht 5\' 10"  (1.778 m)  Wt 211 lb (95.709 kg)  BMI 30.28 kg/m2  SpO2 97%    Review of Systems  Musculoskeletal: Positive for back pain.  All other systems reviewed and are negative.       Objective:   Physical Exam He is a well-developed, well-nourished gentleman, mildly  obese. He is oriented x3.  Speech is clear. Affect is bright. He is alert,  cooperative, and pleasant. Follows commands without difficulty, answers  my questions appropriately.  His  reflexes and lower extremities are  Intact.  No abnormal tone, clonus, or tremors are noted.  He is able to  transition easily from sitting to standing.  Gait is not antalgic today.  Heel toe walking is performed adequately but reports back pain increase with toe walking.  Rhomberg with some LU drift not new  Strength in the lower extremities is as follows: Bilateral hip flexors 5 over 5, bilateral knee extensor 5/over 5, bilateral dorsiflexor 5/over 5, plantar flexors bilaterally 5/5, knee flexors 5/5.  He has a very limited lumbar motion with forward flexion and especially  in extension. He complains some discomfort. No tenderness with  palpation in the lumbar spine as noted today.  Non Tender over both SI joint  Pain with extension of lumbar spine         Assessment & Plan:  1. Lumbago.  2. Status post L5-S1 Depuy total disk replacement.  3.L4-L5 facet arthropathy  4. Remote T12 compression  5. Neurogenic claudication.  6. Bilateral hip flexion contracture.  PLAN:  Radiographs from November 20, 2010, did not show listhesis but did show an age indeterminate T12 compression  fracture as well as facet arthropathy at L4-5 and L5-S1 per report. Imaging studies have not been available in past but have been reviewed by surgeons. Will have Derek Sherman obtain old imaging studies.. Encourage continued use of aquatic therapy transitioning to community facility.  Will refill Vicodin.  Gabapentin as noted above one p.o. q.8 a.m., noon, 3:00 p.m., 7:00 p.m.,  and 2 at bedtime.  Would like to try to minimize use of narcotic pain medications.  Derek Sherman takes his pain medications as prescribed. No evidence of aberrant behavior, pill counts are appropriate. No evidence of HI/SI.  Right sided SI injection gave 1 1/2 weeks of relief, bilat SI essentially no relief.   Continue pool therapy. Will discuss consideration of medial branch blocks after holidays.

## 2012-02-23 NOTE — Patient Instructions (Signed)
Please keep your pain medications locked up and in a secure location.  I'm glad that you are still continuing to have benefits from pool therapy.  We discussed your old T12 compression and you were going to obtain previous scans for me to review.  I will see you in 5 weeks.

## 2012-02-28 ENCOUNTER — Telehealth: Payer: Self-pay | Admitting: *Deleted

## 2012-02-28 DIAGNOSIS — M24559 Contracture, unspecified hip: Secondary | ICD-10-CM

## 2012-02-28 DIAGNOSIS — M549 Dorsalgia, unspecified: Secondary | ICD-10-CM

## 2012-02-28 NOTE — Telephone Encounter (Signed)
Therapist is requesting a note from MD to get Mr Derek Sherman into a [employer].

## 2012-03-01 NOTE — Telephone Encounter (Signed)
I called Jessica from [facility] but no answer and VM was generic so no message left. Will try again later.

## 2012-03-01 NOTE — Telephone Encounter (Signed)
Sybil, could you find out which wellness program they might be referring to and get  some information on it to review before we recommend it.

## 2012-03-02 NOTE — Telephone Encounter (Signed)
Jessica at [facility] says he either needs a new referral for PT aquatic based therapy so that [facility] will pay until he is able to move to land based therapy, he only has a few visits left, or to a wellness program that has [facility] like [facility]

## 2012-03-02 NOTE — Addendum Note (Signed)
Addended by: Doreene Eland on: 03/02/2012 03:58 PM   Modules accepted: Orders

## 2012-03-02 NOTE — Telephone Encounter (Signed)
Re ordered the [facility] aquatic therapy instead if Wellness program

## 2012-03-13 ENCOUNTER — Telehealth: Payer: Self-pay | Admitting: *Deleted

## 2012-03-13 DIAGNOSIS — M545 Low back pain: Secondary | ICD-10-CM

## 2012-03-13 NOTE — Telephone Encounter (Signed)
Patient needs renewal on Aqua Therapy sent to [facility].

## 2012-03-14 NOTE — Telephone Encounter (Signed)
New order placed for aquatic therapy.

## 2012-03-22 ENCOUNTER — Telehealth: Payer: Self-pay | Admitting: Physical Medicine and Rehabilitation

## 2012-03-22 NOTE — Telephone Encounter (Signed)
[employer] requesting an order stating "maintenance aqua therapy at [facility]" so patient may continue on his own with out joining a gym/fitness center.

## 2012-03-24 NOTE — Telephone Encounter (Signed)
Order for Derek Sherman:  "Maintenance aqua therapy at [facility]" so patient may continue on his own with out joining a gym/fitness center.

## 2012-03-29 ENCOUNTER — Telehealth: Payer: Self-pay

## 2012-03-29 DIAGNOSIS — M549 Dorsalgia, unspecified: Secondary | ICD-10-CM

## 2012-03-29 NOTE — Telephone Encounter (Signed)
New order for maintenance aqua therapy ordered for workers comp.

## 2012-04-04 ENCOUNTER — Other Ambulatory Visit: Payer: Self-pay

## 2012-04-04 NOTE — Telephone Encounter (Signed)
Has fever and has had to reschedule appointment.  Needs refill [facility] [address]

## 2012-04-05 ENCOUNTER — Encounter: Payer: Worker's Compensation | Admitting: Physical Medicine and Rehabilitation

## 2012-04-05 MED ORDER — HYDROCODONE-ACETAMINOPHEN 10-325 MG PO TABS: 1.0000 | ORAL_TABLET | Freq: Four times a day (QID) | ORAL | Status: DC | PRN

## 2012-04-05 NOTE — Telephone Encounter (Signed)
Prescription called in to pharmacy

## 2012-04-12 ENCOUNTER — Encounter: Payer: Worker's Compensation | Admitting: Physical Medicine and Rehabilitation

## 2012-04-28 ENCOUNTER — Encounter: Payer: Worker's Compensation | Admitting: Physical Medicine and Rehabilitation

## 2012-05-01 ENCOUNTER — Telehealth: Payer: Self-pay | Admitting: *Deleted

## 2012-05-01 NOTE — Telephone Encounter (Signed)
Case manager called. Appointment was changed to 05/02/12 to see Dr Pamelia Hoit.  He has appointment with his cardiologist that day and can't do that.  He needs to be seen before March and needs to see Dr Pamelia Hoit.  Can you find an appointment for him?

## 2012-05-02 ENCOUNTER — Ambulatory Visit: Payer: Worker's Compensation | Admitting: Physical Medicine and Rehabilitation

## 2012-05-11 ENCOUNTER — Telehealth: Payer: Self-pay

## 2012-05-11 MED ORDER — HYDROCODONE-ACETAMINOPHEN 10-325 MG PO TABS: 1.0000 | ORAL_TABLET | Freq: Four times a day (QID) | ORAL | Status: DC | PRN

## 2012-05-11 NOTE — Telephone Encounter (Signed)
Patient called needing hydrocodone refilled.  [facility].  Unable to contact patient.

## 2012-05-31 ENCOUNTER — Encounter
Payer: Worker's Compensation | Attending: Physical Medicine and Rehabilitation | Admitting: Physical Medicine and Rehabilitation

## 2012-05-31 ENCOUNTER — Encounter: Payer: Self-pay | Admitting: Physical Medicine and Rehabilitation

## 2012-05-31 VITALS — BP 150/81 | HR 72 | Resp 14 | Ht 70.0 in | Wt 217.4 lb

## 2012-05-31 DIAGNOSIS — G8929 Other chronic pain: Secondary | ICD-10-CM | POA: Insufficient documentation

## 2012-05-31 DIAGNOSIS — I739 Peripheral vascular disease, unspecified: Secondary | ICD-10-CM | POA: Insufficient documentation

## 2012-05-31 DIAGNOSIS — Z9889 Other specified postprocedural states: Secondary | ICD-10-CM

## 2012-05-31 DIAGNOSIS — Z5181 Encounter for therapeutic drug level monitoring: Secondary | ICD-10-CM

## 2012-05-31 DIAGNOSIS — M545 Low back pain, unspecified: Secondary | ICD-10-CM | POA: Insufficient documentation

## 2012-05-31 DIAGNOSIS — M24559 Contracture, unspecified hip: Secondary | ICD-10-CM | POA: Insufficient documentation

## 2012-05-31 DIAGNOSIS — R209 Unspecified disturbances of skin sensation: Secondary | ICD-10-CM | POA: Insufficient documentation

## 2012-05-31 DIAGNOSIS — M79609 Pain in unspecified limb: Secondary | ICD-10-CM | POA: Insufficient documentation

## 2012-05-31 MED ORDER — HYDROCODONE-ACETAMINOPHEN 10-325 MG PO TABS: 1.0000 | ORAL_TABLET | Freq: Four times a day (QID) | ORAL | Status: DC | PRN

## 2012-05-31 MED ORDER — GABAPENTIN 300 MG PO CAPS: 300.0000 mg | ORAL_CAPSULE | Freq: Four times a day (QID) | ORAL | Status: DC

## 2012-05-31 NOTE — Patient Instructions (Addendum)
Keep pain meds locked up and in a secure location.  Continue aquatic therapy.  F/u one month for medication monitering

## 2012-05-31 NOTE — Progress Notes (Signed)
Subjective:    Patient ID: Derek Sherman, male    DOB: [birth year], 52 y.o.   MRN: [id number]  HPI   Patient is a 52 year old gentleman and who has a history of L5-S1 Depuy disc replacement 2006 Dr. Lorie Phenix. He has a history of chronic low back pain. Leg pain is not the main problem. He does occasionally have some leg pain which goes down the back of the legs.  No problems with numbness weakness or bowel or bladder control.   Does have a history of hypertension and had a stroke which affected his left side in 2006.  Derek Sherman was followed by Dr. Stevphen Rochester in 2009. He had undergone medial branch blocks at that time however in November of that year he suffered myocardial infarction underwent stenting.  He was treated conservatively after the MI in 2009 with long term medication management.   He had significant problems with blood pressure for many years which made difficult to pursue PT safely. Eventually had a second stroke early in 2012 with left sided weakness also that year underwent he underwent cervical discectomy and fusion Dr. Orlie Pollen on June 25, 2010.   His chief complaint is low back. Pain is worse with extension. Pain is not exacerbated by forward flexion. He has occasional right foot pain which seems to improve with gabapentin.  Low back pain is significantly worse than leg pain.  No new problems regarding bowel or bladder   Patient reports improvement in overall pain since aquatic therapy has been initiated.   Prior he had been unable to participate in physical therapy due to increased pain.  Prior treatment for low back pain has included medial branch blocks and SI injection.  He is here today or a refill of his pain medication and medication monitoring.   His pain has been stable for many months now.  He is not interested in invasive means to manage his pain at this time.  He finds medication and aquatic therapy adequate.     Pain Inventory Average Pain 8 Pain Right Now  8 My pain is constant  In the last 24 hours, has pain interfered with the following? General activity 8 Relation with others 8 Enjoyment of life 8 What TIME of day is your pain at its worst? all Sleep (in general) Poor  Pain is worse with: walking, sitting and standing Pain improves with: rest and medication Relief from Meds: 3  Mobility walk without assistance how many minutes can you walk? 10 ability to climb steps?  yes do you drive?  yes  Function disabled: date disabled 08/2004  Neuro/Psych depression  Prior Studies Any changes since last visit?  no  Physicians involved in your care Any changes since last visit?  no   Family History  Problem Relation Age of Onset  . Hypertension Mother   . Hypertension Father    History   Social History  . Marital Status: Married    Spouse Name: N/A    Number of Children: N/A  . Years of Education: N/A   Social History Main Topics  . Smoking status: Current Every Day Smoker -- 28 years    Types: Cigarettes  . Smokeless tobacco: Never Used     Comment: has been 1 week without smoking (using electronic)  . Alcohol Use: None  . Drug Use: None  . Sexually Active: None   Other Topics Concern  . None   Social History Narrative  . None   Past Surgical History  Procedure  Laterality Date  . Spine surgery    . Coronary artery bypass graft    . Tonsilectomy, adenoidectomy, bilateral myringotomy and tubes     Past Medical History  Diagnosis Date  . Depression   . Hyperlipidemia   . Hypertension    BP 150/81  Pulse 72  Resp 14  Ht 5\' 10"  (1.778 m)  Wt 217 lb 6.4 oz (98.612 kg)  BMI 31.19 kg/m2  SpO2 97%    Review of Systems  Musculoskeletal: Positive for back pain.  Psychiatric/Behavioral: Positive for dysphoric mood.  All other systems reviewed and are negative.       Objective:   Physical Exam  He is a well-developed, well-nourished gentleman, mildly  obese. He is oriented x3.  Speech is clear.  Affect is bright. He is alert,  cooperative, and pleasant. Follows commands without difficulty, answers  my questions appropriately.  His reflexes and lower extremities are  Intact.  No abnormal tone, clonus, or tremors are noted.  He is able to transition easily from sitting to standing.  Gait is not antalgic today.  Heel toe walking is performed adequately but reports back pain increase with toe walking.   Rhomberg with some LU drift not new (secondary to previous stroke),tandem gait is performed well.  Strength in the lower extremities is as follows: Bilateral hip flexors 5 over 5, bilateral knee extensor 5/over 5, bilateral dorsiflexor 5/over 5, plantar flexors bilaterally 5/5, knee flexors 4+/5.   Hip flexion contracture 15% bilaterally.  No tenderness to palpation in lower thoracic and lumbar spine or paraspinal musculature.  He has a very limited lumbar motion with forward flexion and especially  in extension.  He complains some discomfort in the low back at the lumbosacral junction with extension. No tenderness with palpation in the lumbar spine as noted today. Lateral flexion is limited 30%. Non Tender over both SI joint  Pain with extension of lumbar spine       Assessment & Plan:  1. Lumbago.  2. Status post L5-S1 Depuy total disk replacement with chronic low back pain, limited motion. 3.L4-L5 facet arthropathy  4. Remote T12 compression (date unknown)  5. Neurogenic claudication.  6. 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 Imaging studies have not been available in past but have been reviewed by surgeons. Will have Derek Sherman obtain old imaging studies..  Encourage continued use of aquatic therapy transitioning to community facility.  Will refill Vicodin.  Gabapentin as  noted above one p.o. q.8 a.m., noon, 3:00 p.m., 7:00 p.m.,  and 2 at bedtime.  Would like to try to minimize use of narcotic pain medications.  Derek Sherman takes his pain medications as prescribed. No evidence of aberrant behavior, pill counts are appropriate. No evidence of HI/SI.  Right sided SI injection gave 1 1/2 weeks of relief, bilat SI essentially no relief.  Continue pool therapy.   *RADIOLOGY REPORT* 12/22/11 Clinical Data: Status post Zerita Boers disc replacement  LUMBAR SPINE - COMPLETE WITH BENDING VIEWS  Comparison: None.  Findings: Seven views of the lumbar spine submitted. Postsurgical  changes post artificial disc replacement noted at L5 S1 level. No  acute fracture or subluxation. Mild disc space flattening with  mild anterior spurring noted at T12-L1 and L1-L2 level.  Flexion/extension views shows no evidence of the  lumbar  instability. Alignment at L5 S1 level is preserved on flexion  extension views.  A vascular stent is noted in the left common iliac artery region.  IMPRESSION:  No acute fracture or subluxation. Postsurgical changes post disc  replacement at L5 S1 level. No evidence of the lumbar instability  on flexion extension views. Mild degenerative changes T12-L1 and L1  - L2 level.  Original Report Authenticated By: Natasha Mead, M.D.  Derek Sherman reports he talked to [facility] radiologist and was told that T12 was not well visualized on previous imaging studies.  He did not bring in reports for me to review but would like me to pursue rating nontheless.   Ruptured Lumbar Discs The following guide is suggested for use in rating of patients with ruptured lumbar discs from the standpoint of permanent partial impairment to the back, as recommended by the [facility] and the neurosurgeons of [place]:Ruptured Lumbar Discs The following guide is suggested for use in rating of patients with ruptured lumbar discs from the standpoint of  permanent partial impairment to the back, as recommended by the [employer] and the neurosurgeons of [place]: 1. Typical episode of back and leg pain that completely recovers without neurological defect on conservative therapy = 0% 2. Same as (1) with recurrent episodes of significant back pain associated with objective findings = 5-10% 3. Postoperative-removal of ruptured disc-free of back and leg pain-no weakness = 5% 4. Postoperative-with recurrent episodes of significant back pain associated with objective findings = 10-15% 5. Postoperative-removal of ruptured disc and spinal fusion. Same as (3) = 25% 6. Postoperative-removal of rupture disc and spinal fusion. Same as (4) = 25-30%     Hip-Percentage of Impairment of Whole Leg  7. Ankylosis of the hip in optimum position (in less satisfactory position, appropriately higher percentage) = 50%  8. Arthroplasty of hip using prosthesis or cup (optimum results) = 40%  9. Limitation of motion of Hip  1. Mild Deformity = 15%  Example: AP motion: between 0 degrees and 80 degrees flexion Lateral motion: 15 degrees adduction to 15 degrees abduction Rotation: 20 degrees internal to 20 degrees external rotation  Impairments:  This patient has significant back pain associated with objective findings undergoing Depuy disc replacement at L5-S1 = 25%of back               Bilateral Hip Flexion contractures= 15%of leg

## 2012-06-07 ENCOUNTER — Telehealth: Payer: Self-pay

## 2012-06-07 NOTE — Telephone Encounter (Signed)
Patient called to say he has misplaced his hydrocodone script.  Per Dr Pamelia Hoit she will not refill it until he is due next month.

## 2012-06-28 ENCOUNTER — Encounter
Payer: Worker's Compensation | Attending: Physical Medicine and Rehabilitation | Admitting: Physical Medicine and Rehabilitation

## 2012-06-28 ENCOUNTER — Encounter: Payer: Self-pay | Admitting: Physical Medicine and Rehabilitation

## 2012-06-28 VITALS — BP 126/79 | HR 60 | Resp 14 | Ht 68.0 in | Wt 215.0 lb

## 2012-06-28 DIAGNOSIS — Z79899 Other long term (current) drug therapy: Secondary | ICD-10-CM

## 2012-06-28 DIAGNOSIS — M47817 Spondylosis without myelopathy or radiculopathy, lumbosacral region: Secondary | ICD-10-CM | POA: Insufficient documentation

## 2012-06-28 DIAGNOSIS — M961 Postlaminectomy syndrome, not elsewhere classified: Secondary | ICD-10-CM

## 2012-06-28 DIAGNOSIS — Z5181 Encounter for therapeutic drug level monitoring: Secondary | ICD-10-CM

## 2012-06-28 DIAGNOSIS — M533 Sacrococcygeal disorders, not elsewhere classified: Secondary | ICD-10-CM | POA: Insufficient documentation

## 2012-06-28 MED ORDER — HYDROCODONE-ACETAMINOPHEN 10-325 MG PO TABS: 1.0000 | ORAL_TABLET | Freq: Four times a day (QID) | ORAL | Status: DC | PRN

## 2012-06-28 NOTE — Progress Notes (Signed)
Subjective:    Patient ID: Derek Sherman, male    DOB: 1960/03/22, 52 y.o.   MRN: [id number]  HPI  Patient is a 52 year old gentleman and who has a history of L5-S1 Depuy disc replacement 2006 Dr. Lorie Phenix. He has a history of chronic low back pain. Leg pain is not the main problem. He does occasionally have some leg pain which goes down the back of the legs.  No problems with numbness weakness or bowel or bladder control.  Does have a history of hypertension and had a stroke which affected his left side in 2006.  Derek Sherman was followed by Dr. Stevphen Rochester in 2009. He had undergone medial branch blocks at that time however in November of that year he suffered myocardial infarction underwent stenting.  He was treated conservatively after the MI in 2009 with long term medication management.  He had significant problems with blood pressure for many years which made difficult to pursue PT safely. Eventually had a second stroke early in 2012 with left sided weakness also that year underwent he underwent cervical discectomy and fusion Dr. Orlie Pollen on June 25, 2010.  His chief complaint is low back. Pain is worse with extension. Pain is not exacerbated by forward flexion. He has occasional right foot pain which seems to improve with gabapentin.  Low back pain is significantly worse than leg pain.  No new problems regarding bowel or bladder  Patient reports improvement in overall pain since aquatic therapy has been initiated.  Prior he had been unable to participate in physical therapy due to increased pain.  Prior treatment for low back pain has included medial branch blocks and SI injection.  He is here today or a refill of his pain medication and medication monitoring.  His pain has been stable for many months now.  He is not interested in invasive means to manage his pain at this time.  He finds medication and aquatic therapy adequate.     Pain Inventory Average Pain 8 Pain Right Now 8 My pain is  constant, sharp, stabbing and aching  In the last 24 hours, has pain interfered with the following? General activity 8 Relation with others 7 Enjoyment of life 7 What TIME of day is your pain at its worst? all Sleep (in general) Poor  Pain is worse with: walking, sitting and standing Pain improves with: rest, therapy/exercise and medication Relief from Meds: 0  Mobility walk without assistance  Function not employed: date last employed 08/2004 disabled: date disabled .  Neuro/Psych depression  Prior Studies Any changes since last visit?  no  Physicians involved in your care Any changes since last visit?  no   Family History  Problem Relation Age of Onset  . Hypertension Mother   . Hypertension Father    History   Social History  . Marital Status: Married    Spouse Name: N/A    Number of Children: N/A  . Years of Education: N/A   Social History Main Topics  . Smoking status: Current Every Day Smoker -- 28 years    Types: Cigarettes  . Smokeless tobacco: Never Used     Comment: has been 1 week without smoking (using electronic)  . Alcohol Use: None  . Drug Use: None  . Sexually Active: None   Other Topics Concern  . None   Social History Narrative  . None   Past Surgical History  Procedure Laterality Date  . Spine surgery    . Coronary artery bypass graft    .  Tonsilectomy, adenoidectomy, bilateral myringotomy and tubes     Past Medical History  Diagnosis Date  . Depression   . Hyperlipidemia   . Hypertension    BP 126/79  Pulse 60  Resp 14  Ht 5\' 8"  (1.727 m)  Wt 215 lb (97.523 kg)  BMI 32.7 kg/m2  SpO2 99%     Review of Systems  Psychiatric/Behavioral: Positive for dysphoric mood.  All other systems reviewed and are negative.       Objective:   Physical Exam  He is a well-developed, well-nourished gentleman, mildly  obese. He is oriented x3.  Speech is clear. Affect is bright. He is alert,  cooperative, and pleasant. Follows  commands without difficulty, answers  my questions appropriately.  His reflexes and lower extremities are  Intact.  No abnormal tone, clonus, or tremors are noted.  He is able to transition easily from sitting to standing.  Gait is not antalgic today.  Heel toe walking is performed adequately but reports back pain increase with toe walking.  Rhomberg with some LU drift not new (secondary to previous stroke),tandem gait is performed well.  Strength in the lower extremities is as follows: Bilateral hip flexors 5 over 5, bilateral knee extensor 5/over 5, bilateral dorsiflexor 5/over 5, plantar flexors bilaterally 5/5, knee flexors 4+/5.  Hip flexion contracture 10-15% bilaterally.  No tenderness to palpation in lower thoracic and lumbar spine or paraspinal musculature.  He has a very limited lumbar motion with forward flexion and especially  in extension (0).  He complains some discomfort in the low back at the lumbosacral junction with extension. No tenderness with palpation in the lumbar spine as noted today. Lateral flexion is limited 30%.  Non Tender over both SI joint  Pain with extension of lumbar spine        Assessment & Plan:  1. Lumbago.  2. Status post L5-S1 Depuy total disk replacement with chronic low back pain, limited motion.  3.L4-L5 facet arthropathy  4. Remote T12 compression (date unknown)  5. Neurogenic claudication.  6. Bilateral hip flexion contracture   PLAN:  Radiographs from November 20, 2010, did not show listhesis but did show an age indeterminate T12 compression  fracture as well as facet arthropathy at L4-5 and L5-S1 per report. Imaging studies have not been available in past but have been reviewed by surgeons. Will have Derek Sherman obtain old imaging studies..   Will refill Vicodin.  Gabapentin as noted above one p.o. q.8 a.m., noon, 3:00 p.m., 7:00 p.m.,  and 2 at bedtime.  Would like to try to minimize use of narcotic pain medications.  Derek Sherman  takes his pain medications as prescribed. No evidence of aberrant behavior, pill counts are appropriate. No evidence of HI/SI.  Right sided SI injection gave 1 1/2 weeks of relief, bilat SI essentially no relief.   *Continue aquatic therapy.  PT reports no changes in ROM but pain sx are overall improved.    11/13 UDS consistant Random UDS today to be checked at next visit.  F/U one month Derek Sherman

## 2012-06-28 NOTE — Patient Instructions (Signed)
I have refilled your pain medications.  Please keep them locked up and in asecure location.  I am glad the aquatic therapy is helpful.  F/u with Dr. Wynn Banker.

## 2012-07-25 ENCOUNTER — Ambulatory Visit: Payer: Worker's Compensation | Admitting: Physical Medicine & Rehabilitation

## 2012-07-28 ENCOUNTER — Encounter
Payer: Worker's Compensation | Attending: Physical Medicine and Rehabilitation | Admitting: Physical Medicine and Rehabilitation

## 2012-07-28 ENCOUNTER — Encounter: Payer: Self-pay | Admitting: Physical Medicine and Rehabilitation

## 2012-07-28 VITALS — BP 120/74 | HR 58 | Resp 14 | Ht 68.0 in | Wt 211.0 lb

## 2012-07-28 DIAGNOSIS — M24559 Contracture, unspecified hip: Secondary | ICD-10-CM | POA: Insufficient documentation

## 2012-07-28 DIAGNOSIS — M961 Postlaminectomy syndrome, not elsewhere classified: Secondary | ICD-10-CM

## 2012-07-28 DIAGNOSIS — M25551 Pain in right hip: Secondary | ICD-10-CM

## 2012-07-28 DIAGNOSIS — M79609 Pain in unspecified limb: Secondary | ICD-10-CM | POA: Insufficient documentation

## 2012-07-28 DIAGNOSIS — M545 Low back pain, unspecified: Secondary | ICD-10-CM | POA: Insufficient documentation

## 2012-07-28 DIAGNOSIS — G8929 Other chronic pain: Secondary | ICD-10-CM | POA: Insufficient documentation

## 2012-07-28 DIAGNOSIS — G450 Vertebro-basilar artery syndrome: Secondary | ICD-10-CM | POA: Insufficient documentation

## 2012-07-28 DIAGNOSIS — M25559 Pain in unspecified hip: Secondary | ICD-10-CM

## 2012-07-28 MED ORDER — HYDROCODONE-ACETAMINOPHEN 10-325 MG PO TABS: 1.0000 | ORAL_TABLET | Freq: Four times a day (QID) | ORAL | Status: DC | PRN

## 2012-07-28 NOTE — Progress Notes (Signed)
Subjective:    Patient ID: Derek Sherman, male    DOB: 17-Nov-1960, 52 y.o.   MRN: [id number]  HPI The patient complains about chronic low back pain which radiates into the left posterior LE to the sole of his feet. The patient also complains about numbness and tingling in the same distribution.  The problem has been stable. The patient has a history of disc replacement at L5-S1 in 2006, and also a history of a CVA in 2006 . He reports that he has had a stent placed into his right femoral artery in 2013. He is doing aquatic exercises regularly, which helps with his problems.   Pain Inventory Average Pain 8 Pain Right Now 8 My pain is constant, sharp, stabbing and aching  In the last 24 hours, has pain interfered with the following? General activity 7 Relation with others 7 Enjoyment of life 7 What TIME of day is your pain at its worst? all the time Sleep (in general) Poor  Pain is worse with: walking, sitting and standing Pain improves with: rest, therapy/exercise and medication Relief from Meds: 4  Mobility walk without assistance how many minutes can you walk? 10 transfers alone Do you have any goals in this area?  yes  Function disabled: date disabled 2006 Do you have any goals in this area?  yes  Neuro/Psych depression  Prior Studies Any changes since last visit?  no  Physicians involved in your care Any changes since last visit?  no   Family History  Problem Relation Age of Onset  . Hypertension Mother   . Hypertension Father    History   Social History  . Marital Status: Married    Spouse Name: N/A    Number of Children: N/A  . Years of Education: N/A   Social History Main Topics  . Smoking status: Current Every Day Smoker -- 28 years    Types: Cigarettes  . Smokeless tobacco: Never Used     Comment: has been 1 week without smoking (using electronic)  . Alcohol Use: None  . Drug Use: None  . Sexually Active: None   Other Topics Concern  . None    Social History Narrative  . None   Past Surgical History  Procedure Laterality Date  . Spine surgery    . Coronary artery bypass graft    . Tonsilectomy, adenoidectomy, bilateral myringotomy and tubes     Past Medical History  Diagnosis Date  . Depression   . Hyperlipidemia   . Hypertension    BP 120/74  Pulse 58  Resp 14  Ht 5\' 8"  (1.727 m)  Wt 211 lb (95.709 kg)  BMI 32.09 kg/m2  SpO2 93%     Review of Systems  Musculoskeletal: Positive for back pain.  Psychiatric/Behavioral: Positive for dysphoric mood.  All other systems reviewed and are negative.       Objective:   Physical Exam Constitutional: He is oriented to person, place, and time. He appears well-developed and well-nourished.  Neck: Neck supple.  Musculoskeletal: He exhibits tenderness.  Neurological: He is alert and oriented to person, place, and time.  Skin: Skin is warm and dry.  Psychiatric: He has a normal mood and affect.  Symmetric normal motor tone is noted throughout. Normal muscle bulk. Muscle testing reveals 5/5 muscle strength of the upper extremity, and 5/5 of the lower extremity on the right, 4+ on the left throughout. Full range of motion in upper and lower extremities, except right hip, restricted into abduction  and extension. ROM of spine is restricted. Fine motor movements are normal in both hands.  Sensory is intact and symmetric to light touch, pinprick and proprioception.  DTR in the upper and lower extremity are present and symmetric 2+. No clonus is noted.  Patient arises from chair with difficulty. Narrow based gait with flexed L-spine, antalgic gait, normal arm swing bilateral , able to walk on heels and toes . Tandem walk is stable. No pronator drift. Rhomberg negative.        Assessment & Plan:  1. Lumbago.  2. Status post L5-S1 Depuy total disk replacement.  3. Neurogenic claudication.  4. Bilateral hip flexion contracture.  5. Hx of Stent placed into his right femoral  artery .  PLAN: We will refill his Vicodin up to 4 times per day, #120; Showed patient stretching exercises for his iliopsoas bilateral, which is very tide because of his posture.  Patient should continue with his aquatic exercises, which are very beneficial for him.  Patient is at [facility], Dr. Pamelia Hoit determined 25 % for his back, and 15 % for his right hip. Follow up in 6 weeks.

## 2012-07-28 NOTE — Patient Instructions (Signed)
Continue with your exercises in the pool

## 2012-08-25 ENCOUNTER — Encounter: Payer: Self-pay | Admitting: Physical Medicine and Rehabilitation

## 2012-08-25 ENCOUNTER — Encounter
Payer: Worker's Compensation | Attending: Physical Medicine and Rehabilitation | Admitting: Physical Medicine and Rehabilitation

## 2012-08-25 VITALS — BP 155/87 | HR 87 | Resp 14 | Ht 68.0 in | Wt 212.0 lb

## 2012-08-25 DIAGNOSIS — G8929 Other chronic pain: Secondary | ICD-10-CM

## 2012-08-25 DIAGNOSIS — G988 Other disorders of nervous system: Secondary | ICD-10-CM | POA: Insufficient documentation

## 2012-08-25 DIAGNOSIS — R209 Unspecified disturbances of skin sensation: Secondary | ICD-10-CM | POA: Insufficient documentation

## 2012-08-25 DIAGNOSIS — M545 Low back pain, unspecified: Secondary | ICD-10-CM | POA: Insufficient documentation

## 2012-08-25 DIAGNOSIS — M24559 Contracture, unspecified hip: Secondary | ICD-10-CM | POA: Insufficient documentation

## 2012-08-25 DIAGNOSIS — Z9689 Presence of other specified functional implants: Secondary | ICD-10-CM | POA: Insufficient documentation

## 2012-08-25 DIAGNOSIS — Z8673 Personal history of transient ischemic attack (TIA), and cerebral infarction without residual deficits: Secondary | ICD-10-CM | POA: Insufficient documentation

## 2012-08-25 DIAGNOSIS — M961 Postlaminectomy syndrome, not elsewhere classified: Secondary | ICD-10-CM

## 2012-08-25 MED ORDER — METHOCARBAMOL 500 MG PO TABS: 500.0000 mg | ORAL_TABLET | Freq: Two times a day (BID) | ORAL | Status: DC

## 2012-08-25 MED ORDER — HYDROCODONE-ACETAMINOPHEN 10-325 MG PO TABS: 1.0000 | ORAL_TABLET | Freq: Four times a day (QID) | ORAL | Status: DC | PRN

## 2012-08-25 NOTE — Progress Notes (Signed)
Subjective:    Patient ID: Derek Sherman, male    DOB: 19-Jul-1960, 52 y.o.   MRN: [id number]  HPI The patient complains about chronic low back pain which radiates into the left posterior LE to the sole of his feet. The patient also complains about numbness and tingling in the same distribution.  The problem has been stable. The patient has a history of disc replacement at L5-S1 in 2006, and also a history of a CVA in 2006 . He reports that he has had a stent placed into his right femoral artery in 2013. He is doing aquatic exercises regularly, which helps with his problems. He reports that he will leave town tomorrow to visit his mother in law, who has stage 4 lung cancer, and he had to come in a little earlier.  Pain Inventory Average Pain 8 Pain Right Now 8 My pain is constant, sharp, stabbing and aching  In the last 24 hours, has pain interfered with the following? General activity 8 Relation with others 8 Enjoyment of life 8 What TIME of day is your pain at its worst? constant Sleep (in general) Fair  Pain is worse with: walking, sitting and standing Pain improves with: rest, therapy/exercise and medication Relief from Meds: 3  Mobility how many minutes can you walk? 10 ability to climb steps?  yes do you drive?  yes transfers alone Do you have any goals in this area?  yes  Function disabled: date disabled 2006 Do you have any goals in this area?  yes  Neuro/Psych No problems in this area  Prior Studies Any changes since last visit?  no  Physicians involved in your care Any changes since last visit?  no   Family History  Problem Relation Age of Onset  . Hypertension Mother   . Hypertension Father    History   Social History  . Marital Status: Married    Spouse Name: N/A    Number of Children: N/A  . Years of Education: N/A   Social History Main Topics  . Smoking status: Current Every Day Smoker -- 28 years    Types: Cigarettes  . Smokeless tobacco:  Never Used     Comment: has been 1 week without smoking (using electronic)  . Alcohol Use: None  . Drug Use: None  . Sexually Active: None   Other Topics Concern  . None   Social History Narrative  . None   Past Surgical History  Procedure Laterality Date  . Spine surgery    . Coronary artery bypass graft    . Tonsilectomy, adenoidectomy, bilateral myringotomy and tubes     Past Medical History  Diagnosis Date  . Depression   . Hyperlipidemia   . Hypertension    BP 155/87  Pulse 87  Resp 14  Ht 5\' 8"  (1.727 m)  Wt 212 lb (96.163 kg)  BMI 32.24 kg/m2  SpO2 94%     Review of Systems  Musculoskeletal: Positive for back pain.  All other systems reviewed and are negative.       Objective:   Physical Exam Constitutional: He is oriented to person, place, and time. He appears well-developed and well-nourished.  Neck: Neck supple.  Musculoskeletal: He exhibits tenderness.  Neurological: He is alert and oriented to person, place, and time.  Skin: Skin is warm and dry.  Psychiatric: He has a normal mood and affect.  Symmetric normal motor tone is noted throughout. Normal muscle bulk. Muscle testing reveals 5/5 muscle strength of  the upper extremity, and 5/5 of the lower extremity on the right, 4+ on the left throughout. Full range of motion in upper and lower extremities, except right hip, restricted into abduction and extension. ROM of spine is restricted. Fine motor movements are normal in both hands.  Sensory is intact and symmetric to light touch, pinprick and proprioception.  DTR in the upper and lower extremity are present and symmetric 2+. No clonus is noted.  Patient arises from chair with difficulty. Narrow based gait with flexed L-spine, antalgic gait, normal arm swing bilateral , able to walk on heels and toes . Tandem walk is stable. No pronator drift. Rhomberg negative.        Assessment & Plan:  1. Lumbago.  2. Status post L5-S1 Depuy total disk  replacement.  3. Neurogenic claudication.  4. Bilateral hip flexion contracture.  5. Hx of Stent placed into his right femoral artery .  PLAN: We will refill his Vicodin up to 4 times per day, #120; also refilled his Robaxin. Showed patient stretching exercises for his iliopsoas bilateral, which is very tide because of his posture.  Patient should continue with his aquatic exercises, which are very beneficial for him.  Patient is at MMI, Dr. Pamelia Hoit determined 25 % for his back, and 15 % for his right hip.  Follow up in 6 weeks.

## 2012-08-25 NOTE — Patient Instructions (Signed)
Continue with your aquatic exercising 

## 2012-09-08 ENCOUNTER — Ambulatory Visit: Payer: Self-pay | Admitting: Physical Medicine and Rehabilitation

## 2012-09-26 ENCOUNTER — Encounter: Payer: Worker's Compensation | Admitting: Physical Medicine and Rehabilitation

## 2012-09-27 ENCOUNTER — Telehealth: Payer: Self-pay | Admitting: *Deleted

## 2012-09-27 MED ORDER — HYDROCODONE-ACETAMINOPHEN 10-325 MG PO TABS: 1.0000 | ORAL_TABLET | Freq: Four times a day (QID) | ORAL | Status: DC | PRN

## 2012-09-27 NOTE — Telephone Encounter (Signed)
Refill hydrocodone. Has appt sched for 7/18. refilled

## 2012-09-29 ENCOUNTER — Encounter
Payer: Worker's Compensation | Attending: Physical Medicine and Rehabilitation | Admitting: Physical Medicine and Rehabilitation

## 2012-09-29 ENCOUNTER — Encounter: Payer: Self-pay | Admitting: Physical Medicine and Rehabilitation

## 2012-09-29 VITALS — BP 139/87 | HR 66 | Resp 14 | Ht 68.0 in | Wt 213.0 lb

## 2012-09-29 DIAGNOSIS — M48062 Spinal stenosis, lumbar region with neurogenic claudication: Secondary | ICD-10-CM | POA: Insufficient documentation

## 2012-09-29 DIAGNOSIS — I10 Essential (primary) hypertension: Secondary | ICD-10-CM | POA: Insufficient documentation

## 2012-09-29 DIAGNOSIS — E785 Hyperlipidemia, unspecified: Secondary | ICD-10-CM | POA: Insufficient documentation

## 2012-09-29 DIAGNOSIS — M961 Postlaminectomy syndrome, not elsewhere classified: Secondary | ICD-10-CM

## 2012-09-29 DIAGNOSIS — M24559 Contracture, unspecified hip: Secondary | ICD-10-CM | POA: Insufficient documentation

## 2012-09-29 DIAGNOSIS — M47817 Spondylosis without myelopathy or radiculopathy, lumbosacral region: Secondary | ICD-10-CM

## 2012-09-29 DIAGNOSIS — Z79899 Other long term (current) drug therapy: Secondary | ICD-10-CM | POA: Insufficient documentation

## 2012-09-29 MED ORDER — HYDROCODONE-ACETAMINOPHEN 10-325 MG PO TABS: 1.0000 | ORAL_TABLET | Freq: Four times a day (QID) | ORAL | Status: DC | PRN

## 2012-09-29 NOTE — Patient Instructions (Signed)
Continue with your exercise program 

## 2012-09-29 NOTE — Progress Notes (Signed)
Subjective:    Patient ID: Derek Sherman, male    DOB: [birth year], 52 y.o.   MRN: [id number]  HPI The patient complains about chronic low back pain which radiates into the left posterior LE to the sole of his feet. The patient also complains about numbness and tingling in the same distribution.  The problem has been stable. The patient has a history of disc replacement at L5-S1 in 2006, and also a history of a CVA in 2006 . He reports that he has had a stent placed into his right femoral artery in 2013. He is doing aquatic exercises regularly, which helps with his problems. He reports that he will leave town tomorrow to visit his mother in law, who has stage 4 lung cancer, and he had to come in a little earlier.  Pain Inventory Average Pain 8 Pain Right Now 8 My pain is constant, sharp and stabbing  In the last 24 hours, has pain interfered with the following? General activity 8 Relation with others 8 Enjoyment of life 8 What TIME of day is your pain at its worst? constant Sleep (in general) Fair  Pain is worse with: walking, bending, sitting, inactivity, standing and some activites Pain improves with: pacing activities and medication Relief from Meds: 4  Mobility walk without assistance how many minutes can you walk? 10 ability to climb steps?  yes do you drive?  yes Do you have any goals in this area?  yes  Function disabled: date disabled 2006  Neuro/Psych weakness trouble walking  Prior Studies Any changes since last visit?  no  Physicians involved in your care Any changes since last visit?  no   Family History  Problem Relation Age of Onset  . Hypertension Mother   . Hypertension Father    History   Social History  . Marital Status: Married    Spouse Name: N/A    Number of Children: N/A  . Years of Education: N/A   Social History Main Topics  . Smoking status: Current Every Day Smoker -- 28 years    Types: Cigarettes  . Smokeless tobacco: Never Used      Comment: has been 1 week without smoking (using electronic)  . Alcohol Use: None  . Drug Use: None  . Sexually Active: None   Other Topics Concern  . None   Social History Narrative  . None   Past Surgical History  Procedure Laterality Date  . Spine surgery    . Coronary artery bypass graft    . Tonsilectomy, adenoidectomy, bilateral myringotomy and tubes     Past Medical History  Diagnosis Date  . Depression   . Hyperlipidemia   . Hypertension    BP 139/87  Pulse 66  Resp 14  Ht 5\' 8"  (1.727 m)  Wt 213 lb (96.616 kg)  BMI 32.39 kg/m2  SpO2 94%     Review of Systems  Musculoskeletal: Positive for back pain and gait problem.  Neurological: Positive for weakness.  All other systems reviewed and are negative.       Objective:   Physical Exam Constitutional: He is oriented to person, place, and time. He appears well-developed and well-nourished.  Neck: Neck supple.  Musculoskeletal: He exhibits tenderness.  Neurological: He is alert and oriented to person, place, and time.  Skin: Skin is warm and dry.  Psychiatric: He has a normal mood and affect.  Symmetric normal motor tone is noted throughout. Normal muscle bulk. Muscle testing reveals 5/5 muscle strength of  the upper extremity, and 5/5 of the lower extremity on the right, 4+ on the left throughout. Full range of motion in upper and lower extremities, except right hip, restricted into abduction and extension. ROM of spine is restricted. Fine motor movements are normal in both hands.  Sensory is intact and symmetric to light touch, pinprick and proprioception.  DTR in the upper and lower extremity are present and symmetric 2+. No clonus is noted.  Patient arises from chair with difficulty. Narrow based gait with flexed L-spine, antalgic gait, normal arm swing bilateral , able to walk on heels and toes . Tandem walk is stable. No pronator drift. Rhomberg negative.        Assessment & Plan:  1. Lumbago.  2.  Status post L5-S1 Depuy total disk replacement.  3. Neurogenic claudication.  4. Bilateral hip flexion contracture.  5. Hx of Stent placed into his right femoral artery .  PLAN: We will refill his Vicodin up to 4 times per day, #120; also refilled his Robaxin. Showed patient stretching exercises for his iliopsoas bilateral, which is very tide because of his posture.  Patient should continue with his aquatic exercises, which are very beneficial for him.  Patient is at MMI, Dr. Pamelia Hoit determined 25 % for his back, and 15 % for his right hip.  Follow up in 6 weeks.

## 2012-11-27 ENCOUNTER — Encounter
Payer: Worker's Compensation | Attending: Physical Medicine and Rehabilitation | Admitting: Physical Medicine and Rehabilitation

## 2012-11-27 ENCOUNTER — Encounter: Payer: Self-pay | Admitting: Physical Medicine and Rehabilitation

## 2012-11-27 VITALS — BP 150/93 | HR 70 | Resp 14 | Ht 69.0 in | Wt 217.0 lb

## 2012-11-27 DIAGNOSIS — M545 Low back pain, unspecified: Secondary | ICD-10-CM | POA: Insufficient documentation

## 2012-11-27 DIAGNOSIS — F17200 Nicotine dependence, unspecified, uncomplicated: Secondary | ICD-10-CM | POA: Insufficient documentation

## 2012-11-27 DIAGNOSIS — M24559 Contracture, unspecified hip: Secondary | ICD-10-CM | POA: Insufficient documentation

## 2012-11-27 DIAGNOSIS — R209 Unspecified disturbances of skin sensation: Secondary | ICD-10-CM | POA: Insufficient documentation

## 2012-11-27 DIAGNOSIS — I10 Essential (primary) hypertension: Secondary | ICD-10-CM | POA: Insufficient documentation

## 2012-11-27 DIAGNOSIS — M961 Postlaminectomy syndrome, not elsewhere classified: Secondary | ICD-10-CM

## 2012-11-27 DIAGNOSIS — G8929 Other chronic pain: Secondary | ICD-10-CM | POA: Insufficient documentation

## 2012-11-27 DIAGNOSIS — E785 Hyperlipidemia, unspecified: Secondary | ICD-10-CM | POA: Insufficient documentation

## 2012-11-27 DIAGNOSIS — Z9689 Presence of other specified functional implants: Secondary | ICD-10-CM | POA: Insufficient documentation

## 2012-11-27 MED ORDER — HYDROCODONE-ACETAMINOPHEN 10-325 MG PO TABS: 1.0000 | ORAL_TABLET | Freq: Four times a day (QID) | ORAL | Status: DC | PRN

## 2012-11-27 NOTE — Progress Notes (Signed)
Subjective:    Patient ID: Derek Sherman, male    DOB: 10/20/1960, 52 y.o.   MRN: [id number]  HPI The patient complains about chronic low back pain which radiates into the left posterior LE to the sole of his feet. The patient also complains about numbness and tingling in the same distribution.  The problem has been stable. The patient has a history of disc replacement at L5-S1 in 2006, and also a history of a CVA in 2006 . He reports that he has had a stent placed into his right femoral artery in 2013. He is doing aquatic exercises regularly, which helps with his problems.   Pain Inventory Average Pain 8 Pain Right Now 8 My pain is constant, sharp and stabbing  In the last 24 hours, has pain interfered with the following? General activity 8 Relation with others 8 Enjoyment of life 8 What TIME of day is your pain at its worst? all day Sleep (in general) Poor  Pain is worse with: walking, sitting and standing Pain improves with: rest, therapy/exercise and medication Relief from Meds: 0  Mobility walk without assistance how many minutes can you walk? 10 ability to climb steps?  yes do you drive?  yes transfers alone Do you have any goals in this area?  yes  Function disabled: date disabled 08/2004 Do you have any goals in this area?  yes  Neuro/Psych No problems in this area  Prior Studies Any changes since last visit?  no  Physicians involved in your care Any changes since last visit?  no   Family History  Problem Relation Age of Onset  . Hypertension Mother   . Hypertension Father    History   Social History  . Marital Status: Married    Spouse Name: N/A    Number of Children: N/A  . Years of Education: N/A   Social History Main Topics  . Smoking status: Current Every Day Smoker -- 28 years    Types: Cigarettes  . Smokeless tobacco: Never Used     Comment: has been 1 week without smoking (using electronic)  . Alcohol Use: None  . Drug Use: None  .  Sexual Activity: None   Other Topics Concern  . None   Social History Narrative  . None   Past Surgical History  Procedure Laterality Date  . Spine surgery    . Coronary artery bypass graft    . Tonsilectomy, adenoidectomy, bilateral myringotomy and tubes     Past Medical History  Diagnosis Date  . Depression   . Hyperlipidemia   . Hypertension    BP 150/93  Pulse 70  Resp 14  Ht 5\' 9"  (1.753 m)  Wt 217 lb (98.431 kg)  BMI 32.03 kg/m2  SpO2 94%     Review of Systems  All other systems reviewed and are negative.       Objective:   Physical Exam Constitutional: He is oriented to person, place, and time. He appears well-developed and well-nourished.  Neck: Neck supple.  Musculoskeletal: He exhibits tenderness.  Neurological: He is alert and oriented to person, place, and time.  Skin: Skin is warm and dry.  Psychiatric: He has a normal mood and affect.  Symmetric normal motor tone is noted throughout. Normal muscle bulk. Muscle testing reveals 5/5 muscle strength of the upper extremity, and 5/5 of the lower extremity on the right, 4+ on the left throughout. Full range of motion in upper and lower extremities, except right hip, restricted into  abduction and extension. ROM of spine is restricted. Fine motor movements are normal in both hands.  Sensory is intact and symmetric to light touch, pinprick and proprioception.  DTR in the upper and lower extremity are present and symmetric 2+. No clonus is noted.  Patient arises from chair with difficulty. Narrow based gait with flexed L-spine, antalgic gait, normal arm swing bilateral , able to walk on heels and toes . Tandem walk is stable. No pronator drift. Rhomberg negative.        Assessment & Plan:  1. Lumbago.  2. Status post L5-S1 Depuy total disk replacement.  3. Neurogenic claudication.  4. Bilateral hip flexion contracture.  5. Hx of Stent placed into his right femoral artery .  PLAN: We will refill his  Vicodin up to 4 times per day, #120;to be filled when due . Showed patient stretching exercises for his iliopsoas bilateral, which is very tide because of his posture.  Patient should continue with his aquatic exercises, which are very beneficial for him, he is strengthening and stabilising his back, which reduces his pain/ exacerbations in intensity and frequency.  Patient is at MMI, Dr. Pamelia Hoit determined 25 % for his back, and 15 % for his right hip.  Follow up in 8 weeks.

## 2012-11-27 NOTE — Addendum Note (Signed)
Addended by: Judd Gaudier on: 11/27/2012 11:20 AM   Modules accepted: Orders

## 2012-11-27 NOTE — Patient Instructions (Signed)
Continue with your aquatic exercise program 

## 2013-01-22 ENCOUNTER — Telehealth: Payer: Self-pay

## 2013-01-22 ENCOUNTER — Encounter: Payer: Worker's Compensation | Admitting: Physical Medicine and Rehabilitation

## 2013-01-22 NOTE — Telephone Encounter (Signed)
With our new policy the patient has to come in to refill his hydrocodone

## 2013-01-22 NOTE — Telephone Encounter (Signed)
Patient called regarding his medication.  He had to cancel his appointment due to transportation issues.  He would like [place] to call in his medication.

## 2013-01-22 NOTE — Telephone Encounter (Signed)
Spoke with patient.  Advised him he needs to be seen in order to get prescription.  Also advised him the hydrocodone cannot be called in due to FDA change.  He is going to try to figure out a way to get to the office.

## 2013-01-24 ENCOUNTER — Encounter: Payer: Self-pay | Admitting: Physical Medicine and Rehabilitation

## 2013-01-24 ENCOUNTER — Encounter
Payer: Worker's Compensation | Attending: Physical Medicine and Rehabilitation | Admitting: Physical Medicine and Rehabilitation

## 2013-01-24 VITALS — BP 185/103 | HR 105 | Resp 14 | Ht 69.0 in | Wt 211.4 lb

## 2013-01-24 DIAGNOSIS — G8929 Other chronic pain: Secondary | ICD-10-CM

## 2013-01-24 DIAGNOSIS — E785 Hyperlipidemia, unspecified: Secondary | ICD-10-CM | POA: Insufficient documentation

## 2013-01-24 DIAGNOSIS — M961 Postlaminectomy syndrome, not elsewhere classified: Secondary | ICD-10-CM

## 2013-01-24 DIAGNOSIS — I10 Essential (primary) hypertension: Secondary | ICD-10-CM | POA: Insufficient documentation

## 2013-01-24 DIAGNOSIS — M24559 Contracture, unspecified hip: Secondary | ICD-10-CM | POA: Insufficient documentation

## 2013-01-24 DIAGNOSIS — M545 Low back pain, unspecified: Secondary | ICD-10-CM | POA: Insufficient documentation

## 2013-01-24 DIAGNOSIS — R209 Unspecified disturbances of skin sensation: Secondary | ICD-10-CM | POA: Insufficient documentation

## 2013-01-24 DIAGNOSIS — Z8673 Personal history of transient ischemic attack (TIA), and cerebral infarction without residual deficits: Secondary | ICD-10-CM | POA: Insufficient documentation

## 2013-01-24 DIAGNOSIS — Z9689 Presence of other specified functional implants: Secondary | ICD-10-CM | POA: Insufficient documentation

## 2013-01-24 DIAGNOSIS — F3289 Other specified depressive episodes: Secondary | ICD-10-CM | POA: Insufficient documentation

## 2013-01-24 DIAGNOSIS — F329 Major depressive disorder, single episode, unspecified: Secondary | ICD-10-CM | POA: Insufficient documentation

## 2013-01-24 MED ORDER — GABAPENTIN 300 MG PO CAPS: 300.0000 mg | ORAL_CAPSULE | Freq: Four times a day (QID) | ORAL | Status: DC

## 2013-01-24 MED ORDER — HYDROCODONE-ACETAMINOPHEN 10-325 MG PO TABS: 1.0000 | ORAL_TABLET | Freq: Four times a day (QID) | ORAL | Status: DC | PRN

## 2013-01-24 NOTE — Patient Instructions (Signed)
Continue with your aquatic exercise program as pain permits

## 2013-01-24 NOTE — Progress Notes (Signed)
Subjective:    Patient ID: Derek Sherman, male    DOB: [birth year], 52 y.o.   MRN: [id number]  HPI The patient complains about chronic low back pain which radiates into the left posterior LE to the sole of his feet. The patient also complains about numbness and tingling in the same distribution.  The problem has been stable. The patient has a history of disc replacement at L5-S1 in 2006, and also a history of a CVA in 2006 . He reports that he has had a stent placed into his right femoral artery in 2013. He is doing aquatic exercises regularly, which helps with his problems. He states that he is in more pain today because he has finished his pain medication last weekend, he had an appointment Monday, but could not come because of car troubles.  Pain Inventory Average Pain 8 Pain Right Now 10 My pain is constant, sharp, stabbing and aching  In the last 24 hours, has pain interfered with the following? General activity 10 Relation with others 10 Enjoyment of life 10 What TIME of day is your pain at its worst? constant Sleep (in general) Poor  Pain is worse with: walking and sitting Pain improves with: rest, therapy/exercise and medication Relief from Meds: 0  Mobility walk without assistance ability to climb steps?  yes do you drive?  yes transfers alone Do you have any goals in this area?  yes  Function disabled: date disabled 2006 Do you have any goals in this area?  yes  Neuro/Psych No problems in this area  Prior Studies Any changes since last visit?  no  Physicians involved in your care Any changes since last visit?  no   Family History  Problem Relation Age of Onset  . Hypertension Mother   . Hypertension Father    History   Social History  . Marital Status: Married    Spouse Name: N/A    Number of Children: N/A  . Years of Education: N/A   Social History Main Topics  . Smoking status: Current Every Day Smoker -- 28 years    Types: Cigarettes  .  Smokeless tobacco: Never Used     Comment: has been 1 week without smoking (using electronic)  . Alcohol Use: None  . Drug Use: None  . Sexual Activity: None   Other Topics Concern  . None   Social History Narrative  . None   Past Surgical History  Procedure Laterality Date  . Spine surgery    . Coronary artery bypass graft    . Tonsilectomy, adenoidectomy, bilateral myringotomy and tubes     Past Medical History  Diagnosis Date  . Depression   . Hyperlipidemia   . Hypertension    BP 185/103  Pulse 105  Resp 14  Ht 5\' 9"  (1.753 m)  Wt 211 lb 6.4 oz (95.89 kg)  BMI 31.20 kg/m2  SpO2 96%     Review of Systems  Musculoskeletal: Positive for back pain.  All other systems reviewed and are negative.       Objective:   Physical Exam Constitutional: He is oriented to person, place, and time. He appears well-developed and well-nourished.  Neck: Neck supple.  Musculoskeletal: He exhibits tenderness.  Neurological: He is alert and oriented to person, place, and time.  Skin: Skin is warm and dry.  Psychiatric: He has a normal mood and affect.  Symmetric normal motor tone is noted throughout. Normal muscle bulk. Muscle testing reveals 5/5 muscle strength of the  upper extremity, and 5/5 of the lower extremity on the right, 4+ on the left throughout. Full range of motion in upper and lower extremities, except right hip, restricted into abduction and extension. ROM of spine is restricted. Fine motor movements are normal in both hands.  Sensory is intact and symmetric to light touch, pinprick and proprioception.  DTR in the upper and lower extremity are present and symmetric 2+. No clonus is noted.  Patient arises from chair with difficulty. Narrow based gait with flexed L-spine, antalgic gait, normal arm swing bilateral , able to walk on heels and toes . Tandem walk is stable. No pronator drift. Rhomberg negative.        Assessment & Plan:  1. Lumbago.  2. Status post  L5-S1 Depuy total disk replacement.  3. Neurogenic claudication.  4. Bilateral hip flexion contracture.  5. Hx of Stent placed into his right femoral artery .  PLAN: We will refill his Vicodin up to 4 times per day, #120; . Showed patient stretching exercises for his iliopsoas bilateral, which is very tide because of his posture.  Patient should continue with his aquatic exercises, which are very beneficial for him, he is strengthening and stabilising his back, which reduces his pain/ exacerbations in intensity and frequency.  Patient is at MMI, Dr. Pamelia Hoit determined 25 % for his back, and 15 % for his right hip.  Follow up in 1 month.

## 2013-01-29 ENCOUNTER — Encounter: Payer: Worker's Compensation | Admitting: Physical Medicine and Rehabilitation

## 2013-02-14 ENCOUNTER — Telehealth: Payer: Self-pay | Admitting: *Deleted

## 2013-02-14 MED ORDER — HYDROCODONE-ACETAMINOPHEN 10-325 MG PO TABS: 1.0000 | ORAL_TABLET | Freq: Four times a day (QID) | ORAL | Status: DC | PRN

## 2013-02-14 NOTE — Telephone Encounter (Signed)
rx printed early for controlled medication for the visit with RN on 02/20/13 (to be signed by MD)

## 2013-02-26 ENCOUNTER — Encounter: Payer: Self-pay | Admitting: *Deleted

## 2013-02-26 ENCOUNTER — Encounter: Payer: Worker's Compensation | Attending: Physical Medicine & Rehabilitation | Admitting: *Deleted

## 2013-02-26 VITALS — BP 182/87 | HR 66 | Resp 14 | Ht 69.0 in | Wt 211.0 lb

## 2013-02-26 DIAGNOSIS — E785 Hyperlipidemia, unspecified: Secondary | ICD-10-CM | POA: Insufficient documentation

## 2013-02-26 DIAGNOSIS — Z8673 Personal history of transient ischemic attack (TIA), and cerebral infarction without residual deficits: Secondary | ICD-10-CM | POA: Diagnosis not present

## 2013-02-26 DIAGNOSIS — F329 Major depressive disorder, single episode, unspecified: Secondary | ICD-10-CM | POA: Insufficient documentation

## 2013-02-26 DIAGNOSIS — M961 Postlaminectomy syndrome, not elsewhere classified: Secondary | ICD-10-CM

## 2013-02-26 DIAGNOSIS — M549 Dorsalgia, unspecified: Secondary | ICD-10-CM

## 2013-02-26 DIAGNOSIS — G8929 Other chronic pain: Secondary | ICD-10-CM

## 2013-02-26 DIAGNOSIS — M24559 Contracture, unspecified hip: Secondary | ICD-10-CM | POA: Insufficient documentation

## 2013-02-26 DIAGNOSIS — Z951 Presence of aortocoronary bypass graft: Secondary | ICD-10-CM | POA: Diagnosis not present

## 2013-02-26 DIAGNOSIS — F17200 Nicotine dependence, unspecified, uncomplicated: Secondary | ICD-10-CM | POA: Insufficient documentation

## 2013-02-26 DIAGNOSIS — M545 Low back pain, unspecified: Secondary | ICD-10-CM | POA: Insufficient documentation

## 2013-02-26 DIAGNOSIS — I10 Essential (primary) hypertension: Secondary | ICD-10-CM | POA: Diagnosis not present

## 2013-02-26 DIAGNOSIS — R209 Unspecified disturbances of skin sensation: Secondary | ICD-10-CM | POA: Insufficient documentation

## 2013-02-26 DIAGNOSIS — M533 Sacrococcygeal disorders, not elsewhere classified: Secondary | ICD-10-CM

## 2013-02-26 DIAGNOSIS — M47817 Spondylosis without myelopathy or radiculopathy, lumbosacral region: Secondary | ICD-10-CM

## 2013-02-26 DIAGNOSIS — Z9689 Presence of other specified functional implants: Secondary | ICD-10-CM | POA: Insufficient documentation

## 2013-02-26 DIAGNOSIS — F3289 Other specified depressive episodes: Secondary | ICD-10-CM | POA: Insufficient documentation

## 2013-02-26 NOTE — Patient Instructions (Signed)
Please schedule one month follow up with RN for med management  

## 2013-02-26 NOTE — Progress Notes (Signed)
Here for pill count and medication refills. Fill date 01/24/13 # 120     Today NV# 0 appropriate  No changes in medication.  Pain level 9 today because meds out over the weekend appropriately.  BP elevate because of pain too.  Refill given. follow up in one month with RN for pill count and med refill

## 2013-03-26 ENCOUNTER — Other Ambulatory Visit: Payer: Self-pay | Admitting: *Deleted

## 2013-03-26 MED ORDER — HYDROCODONE-ACETAMINOPHEN 10-325 MG PO TABS: 1.0000 | ORAL_TABLET | Freq: Four times a day (QID) | ORAL | Status: DC | PRN

## 2013-03-26 NOTE — Telephone Encounter (Signed)
RX printed early for controlled medication for the visit with RN on 03/28/13 (to be signed by MD) 

## 2013-03-28 ENCOUNTER — Encounter: Payer: Worker's Compensation | Attending: Physical Medicine & Rehabilitation | Admitting: *Deleted

## 2013-03-28 ENCOUNTER — Encounter: Payer: Self-pay | Admitting: *Deleted

## 2013-03-28 VITALS — BP 153/83 | HR 77 | Resp 77 | Ht 69.0 in | Wt 210.6 lb

## 2013-03-28 DIAGNOSIS — M545 Low back pain, unspecified: Secondary | ICD-10-CM | POA: Diagnosis not present

## 2013-03-28 DIAGNOSIS — M533 Sacrococcygeal disorders, not elsewhere classified: Secondary | ICD-10-CM | POA: Diagnosis not present

## 2013-03-28 DIAGNOSIS — M549 Dorsalgia, unspecified: Secondary | ICD-10-CM | POA: Diagnosis not present

## 2013-03-28 DIAGNOSIS — Z9889 Other specified postprocedural states: Secondary | ICD-10-CM | POA: Diagnosis not present

## 2013-03-28 DIAGNOSIS — Z760 Encounter for issue of repeat prescription: Secondary | ICD-10-CM | POA: Diagnosis present

## 2013-03-28 DIAGNOSIS — I10 Essential (primary) hypertension: Secondary | ICD-10-CM | POA: Diagnosis not present

## 2013-03-28 NOTE — Patient Instructions (Signed)
Follow up one month with Rn for med refill or PA

## 2013-03-28 NOTE — Progress Notes (Signed)
Here for pill count and medication refills. Hydrocodone 10/325 # 120 Fill date  02/26/13  Today NV# 0  Pill count is appropriate  VSS  His BP is elevated but he has not taken his BP med today but will take as soon as he gets home.    Pain level:8  No changes in pain or medication list.  Follow up in one month with me for med refill.  He is workmans com but is getting ready to go off this.  He did not feel he needed to see an MD at this time.  Hhis opioid risk score is 0 today.

## 2013-04-18 ENCOUNTER — Other Ambulatory Visit: Payer: Self-pay | Admitting: *Deleted

## 2013-04-18 MED ORDER — HYDROCODONE-ACETAMINOPHEN 10-325 MG PO TABS: 1.0000 | ORAL_TABLET | Freq: Four times a day (QID) | ORAL | Status: DC | PRN

## 2013-04-18 NOTE — Telephone Encounter (Signed)
RX printed early for controlled medication for the visit with RN on 04/25/13 (to be signed by MD) 

## 2013-04-25 ENCOUNTER — Encounter: Payer: Worker's Compensation | Attending: Physical Medicine & Rehabilitation | Admitting: *Deleted

## 2013-04-25 VITALS — BP 161/81 | HR 67 | Resp 14 | Wt 219.2 lb

## 2013-04-25 DIAGNOSIS — Z8673 Personal history of transient ischemic attack (TIA), and cerebral infarction without residual deficits: Secondary | ICD-10-CM | POA: Insufficient documentation

## 2013-04-25 DIAGNOSIS — F329 Major depressive disorder, single episode, unspecified: Secondary | ICD-10-CM | POA: Insufficient documentation

## 2013-04-25 DIAGNOSIS — R209 Unspecified disturbances of skin sensation: Secondary | ICD-10-CM | POA: Insufficient documentation

## 2013-04-25 DIAGNOSIS — Z951 Presence of aortocoronary bypass graft: Secondary | ICD-10-CM | POA: Insufficient documentation

## 2013-04-25 DIAGNOSIS — I10 Essential (primary) hypertension: Secondary | ICD-10-CM | POA: Diagnosis not present

## 2013-04-25 DIAGNOSIS — M545 Low back pain, unspecified: Secondary | ICD-10-CM | POA: Insufficient documentation

## 2013-04-25 DIAGNOSIS — F17200 Nicotine dependence, unspecified, uncomplicated: Secondary | ICD-10-CM | POA: Insufficient documentation

## 2013-04-25 DIAGNOSIS — M24559 Contracture, unspecified hip: Secondary | ICD-10-CM | POA: Insufficient documentation

## 2013-04-25 DIAGNOSIS — M961 Postlaminectomy syndrome, not elsewhere classified: Secondary | ICD-10-CM

## 2013-04-25 DIAGNOSIS — Z9689 Presence of other specified functional implants: Secondary | ICD-10-CM | POA: Diagnosis not present

## 2013-04-25 DIAGNOSIS — M47817 Spondylosis without myelopathy or radiculopathy, lumbosacral region: Secondary | ICD-10-CM

## 2013-04-25 DIAGNOSIS — F3289 Other specified depressive episodes: Secondary | ICD-10-CM | POA: Diagnosis not present

## 2013-04-25 DIAGNOSIS — G8929 Other chronic pain: Secondary | ICD-10-CM | POA: Insufficient documentation

## 2013-04-25 DIAGNOSIS — M549 Dorsalgia, unspecified: Secondary | ICD-10-CM

## 2013-04-25 DIAGNOSIS — E785 Hyperlipidemia, unspecified: Secondary | ICD-10-CM | POA: Diagnosis not present

## 2013-04-25 NOTE — Patient Instructions (Signed)
Follow up one month for refill

## 2013-04-25 NOTE — Progress Notes (Signed)
Here for pill count and medication refills. Hydrocodone 10/325 #120  Fill date 03/28/13   Today NV#   7 VSS    Pain level:8  He has had no falls and is a low fall risk.  Educated and fall prevention in the home handout given even though he has had no falls. His pill count is appropriate.  He will return in a month for med refill.  Refill  Given for hydrocodone 10/325 # 120

## 2013-05-18 ENCOUNTER — Other Ambulatory Visit: Payer: Self-pay | Admitting: *Deleted

## 2013-05-18 MED ORDER — HYDROCODONE-ACETAMINOPHEN 10-325 MG PO TABS: 1.0000 | ORAL_TABLET | Freq: Four times a day (QID) | ORAL | Status: DC | PRN

## 2013-05-18 NOTE — Telephone Encounter (Signed)
RX printed for MD to sign for RN visit 05/21/13 

## 2013-05-21 ENCOUNTER — Encounter: Payer: Self-pay | Attending: Physical Medicine & Rehabilitation | Admitting: *Deleted

## 2013-05-21 ENCOUNTER — Encounter: Payer: Self-pay | Admitting: *Deleted

## 2013-05-21 VITALS — BP 146/73 | HR 69 | Resp 14

## 2013-05-21 DIAGNOSIS — M545 Low back pain, unspecified: Secondary | ICD-10-CM | POA: Insufficient documentation

## 2013-05-21 DIAGNOSIS — I10 Essential (primary) hypertension: Secondary | ICD-10-CM | POA: Insufficient documentation

## 2013-05-21 DIAGNOSIS — F3289 Other specified depressive episodes: Secondary | ICD-10-CM | POA: Insufficient documentation

## 2013-05-21 DIAGNOSIS — M961 Postlaminectomy syndrome, not elsewhere classified: Secondary | ICD-10-CM | POA: Insufficient documentation

## 2013-05-21 DIAGNOSIS — G8929 Other chronic pain: Secondary | ICD-10-CM | POA: Insufficient documentation

## 2013-05-21 DIAGNOSIS — M47817 Spondylosis without myelopathy or radiculopathy, lumbosacral region: Secondary | ICD-10-CM

## 2013-05-21 DIAGNOSIS — Z951 Presence of aortocoronary bypass graft: Secondary | ICD-10-CM | POA: Insufficient documentation

## 2013-05-21 DIAGNOSIS — Z8673 Personal history of transient ischemic attack (TIA), and cerebral infarction without residual deficits: Secondary | ICD-10-CM | POA: Insufficient documentation

## 2013-05-21 DIAGNOSIS — E785 Hyperlipidemia, unspecified: Secondary | ICD-10-CM | POA: Insufficient documentation

## 2013-05-21 DIAGNOSIS — F329 Major depressive disorder, single episode, unspecified: Secondary | ICD-10-CM | POA: Insufficient documentation

## 2013-05-21 DIAGNOSIS — F17200 Nicotine dependence, unspecified, uncomplicated: Secondary | ICD-10-CM | POA: Insufficient documentation

## 2013-05-21 NOTE — Patient Instructions (Addendum)
Follow up one month med refill

## 2013-05-21 NOTE — Progress Notes (Signed)
Here for pill count and medication refills. Hydrocodone 10/325 Fill date 04/26/13   Today NV# 14   VSS  No falls.  Low fall risk.  Was given handout for education on home fall prevention last visit.  No changes.  He asks to return next month to see RN for med refill and will see Dr Wynn BankerKirsteins the following month.  He has settled his WC and is trying to stretch the dollars as much as possible.

## 2013-06-18 ENCOUNTER — Encounter: Payer: Self-pay | Attending: Registered Nurse | Admitting: Registered Nurse

## 2013-06-18 ENCOUNTER — Encounter: Payer: Self-pay | Admitting: Registered Nurse

## 2013-06-18 VITALS — BP 173/104 | HR 81 | Resp 14 | Ht 69.0 in | Wt 220.0 lb

## 2013-06-18 DIAGNOSIS — M545 Low back pain, unspecified: Secondary | ICD-10-CM | POA: Insufficient documentation

## 2013-06-18 DIAGNOSIS — G8929 Other chronic pain: Secondary | ICD-10-CM | POA: Insufficient documentation

## 2013-06-18 DIAGNOSIS — I10 Essential (primary) hypertension: Secondary | ICD-10-CM | POA: Insufficient documentation

## 2013-06-18 DIAGNOSIS — M961 Postlaminectomy syndrome, not elsewhere classified: Secondary | ICD-10-CM | POA: Insufficient documentation

## 2013-06-18 DIAGNOSIS — F329 Major depressive disorder, single episode, unspecified: Secondary | ICD-10-CM | POA: Insufficient documentation

## 2013-06-18 DIAGNOSIS — E785 Hyperlipidemia, unspecified: Secondary | ICD-10-CM | POA: Insufficient documentation

## 2013-06-18 DIAGNOSIS — F17200 Nicotine dependence, unspecified, uncomplicated: Secondary | ICD-10-CM | POA: Insufficient documentation

## 2013-06-18 DIAGNOSIS — Z951 Presence of aortocoronary bypass graft: Secondary | ICD-10-CM | POA: Insufficient documentation

## 2013-06-18 DIAGNOSIS — F3289 Other specified depressive episodes: Secondary | ICD-10-CM | POA: Insufficient documentation

## 2013-06-18 DIAGNOSIS — Z8673 Personal history of transient ischemic attack (TIA), and cerebral infarction without residual deficits: Secondary | ICD-10-CM | POA: Insufficient documentation

## 2013-06-18 MED ORDER — GABAPENTIN 300 MG PO CAPS: 300.0000 mg | ORAL_CAPSULE | Freq: Four times a day (QID) | ORAL | Status: DC

## 2013-06-18 MED ORDER — HYDROCODONE-ACETAMINOPHEN 10-325 MG PO TABS: 1.0000 | ORAL_TABLET | Freq: Four times a day (QID) | ORAL | Status: DC | PRN

## 2013-06-18 NOTE — Progress Notes (Signed)
Subjective:    Patient ID: Derek Sherman, male    DOB: Aug 01, 1960, 53 y.o.   MRN: [id number]  HPI [name] is a 53 year old male who is being seen for chronic low back pain. He states" at times the pain radiates into left posterior lower extremity and sciatica. He denies any numbness or tingling today. His pain level is 8/10. Pain medications are effective. He reports that he is walking, and playing with his children outside. His children are 14, 12, and 9. When he feels tired he takes rest periods.  He is hypertensive today, he admits to taking his anti- hypertensive medications. He believes it's related to being stressed about going back to [place] to pick up his child, due to sickness. Patient educated on medication regime and follow up with his PMD.   Pain Inventory Average Pain 8 Pain Right Now 8 My pain is constant, stabbing and aching  In the last 24 hours, has pain interfered with the following? General activity 8 Relation with others 8 Enjoyment of life 8 What TIME of day is your pain at its worst? constant Sleep (in general) Fair  Pain is worse with: some activites Pain improves with: rest and medication Relief from Meds: n/a  Mobility how many minutes can you walk? 10 ability to climb steps?  yes do you drive?  yes Do you have any goals in this area?  yes  Function disabled: date disabled 08/2004 Do you have any goals in this area?  yes  Neuro/Psych No problems in this area  Prior Studies Any changes since last visit?  no  Physicians involved in your care Any changes since last visit?  no   Family History  Problem Relation Age of Onset  . Hypertension Mother   . Hypertension Father    History   Social History  . Marital Status: Married    Spouse Name: N/A    Number of Children: N/A  . Years of Education: N/A   Social History Main Topics  . Smoking status: Current Every Day Smoker -- 28 years    Types: Cigarettes  . Smokeless tobacco:  Never Used     Comment: has been 1 week without smoking (using electronic)  . Alcohol Use: None  . Drug Use: None  . Sexual Activity: None   Other Topics Concern  . None   Social History Narrative  . None   Past Surgical History  Procedure Laterality Date  . Spine surgery    . Coronary artery bypass graft    . Tonsilectomy, adenoidectomy, bilateral myringotomy and tubes     Past Medical History  Diagnosis Date  . Depression   . Hyperlipidemia   . Hypertension    BP 173/104  Pulse 81  Resp 14  Ht 5\' 9"  (1.753 m)  Wt 220 lb (99.791 kg)  BMI 32.47 kg/m2  SpO2 99%  Opioid Risk Score:   Fall Risk Score: Low Fall Risk (0-5 points) (patient educated handout declined)   Review of Systems  Musculoskeletal: Positive for back pain.  All other systems reviewed and are negative.       Objective:   Physical Exam  Nursing note and vitals reviewed. Constitutional: He is oriented to person, place, and time. He appears well-developed and well-nourished.  HENT:  Head: Normocephalic.  Neck: Neck supple.  Cardiovascular: Normal rate and regular rhythm.   Pulmonary/Chest: Effort normal and breath sounds normal.  Musculoskeletal:  Muscle Strength: Right hand  5/5. Left  4/5. Left hand has decrease mobility and ROM. Lower extremities muscle strength is 5/5.  Narrow based gait.   Neurological: He is alert and oriented to person, place, and time.  Skin: Skin is warm and dry.  Psychiatric: He has a normal mood and affect.          Assessment & Plan:  1. Chronic Low back Pain: Refilled: Norco 10/325 mg # 120.  He should continue with his exercise routine to promote endurance with his activity level.

## 2013-07-13 ENCOUNTER — Ambulatory Visit: Payer: Self-pay | Admitting: Physical Medicine & Rehabilitation

## 2013-07-18 ENCOUNTER — Telehealth: Payer: Self-pay | Admitting: *Deleted

## 2013-07-18 MED ORDER — HYDROCODONE-ACETAMINOPHEN 10-325 MG PO TABS: 1.0000 | ORAL_TABLET | Freq: Four times a day (QID) | ORAL | Status: DC | PRN

## 2013-07-18 NOTE — Telephone Encounter (Signed)
Mr Derek Sherman is going to be out of his medication before 07/24/13 appt.  He will need to pick up rx before the appt.  Printed for Rockwell Automation[employer]unice to sign.  Last rx given on 06/18/13. He will pick up tomorrow.

## 2013-07-24 ENCOUNTER — Encounter: Payer: Self-pay | Admitting: Registered Nurse

## 2013-07-24 ENCOUNTER — Encounter: Payer: Self-pay | Attending: Registered Nurse | Admitting: Registered Nurse

## 2013-07-24 VITALS — BP 193/105 | HR 85 | Resp 14 | Ht 69.0 in | Wt 220.0 lb

## 2013-07-24 DIAGNOSIS — Z8673 Personal history of transient ischemic attack (TIA), and cerebral infarction without residual deficits: Secondary | ICD-10-CM | POA: Insufficient documentation

## 2013-07-24 DIAGNOSIS — Z951 Presence of aortocoronary bypass graft: Secondary | ICD-10-CM | POA: Insufficient documentation

## 2013-07-24 DIAGNOSIS — Z79899 Other long term (current) drug therapy: Secondary | ICD-10-CM

## 2013-07-24 DIAGNOSIS — I10 Essential (primary) hypertension: Secondary | ICD-10-CM | POA: Insufficient documentation

## 2013-07-24 DIAGNOSIS — M545 Low back pain, unspecified: Secondary | ICD-10-CM | POA: Insufficient documentation

## 2013-07-24 DIAGNOSIS — F3289 Other specified depressive episodes: Secondary | ICD-10-CM | POA: Insufficient documentation

## 2013-07-24 DIAGNOSIS — F329 Major depressive disorder, single episode, unspecified: Secondary | ICD-10-CM | POA: Insufficient documentation

## 2013-07-24 DIAGNOSIS — F17200 Nicotine dependence, unspecified, uncomplicated: Secondary | ICD-10-CM | POA: Insufficient documentation

## 2013-07-24 DIAGNOSIS — Z5181 Encounter for therapeutic drug level monitoring: Secondary | ICD-10-CM

## 2013-07-24 DIAGNOSIS — E785 Hyperlipidemia, unspecified: Secondary | ICD-10-CM | POA: Insufficient documentation

## 2013-07-24 DIAGNOSIS — G8929 Other chronic pain: Secondary | ICD-10-CM | POA: Insufficient documentation

## 2013-07-24 DIAGNOSIS — M961 Postlaminectomy syndrome, not elsewhere classified: Secondary | ICD-10-CM | POA: Insufficient documentation

## 2013-07-24 MED ORDER — HYDROCODONE-ACETAMINOPHEN 10-325 MG PO TABS: 1.0000 | ORAL_TABLET | Freq: Four times a day (QID) | ORAL | Status: DC | PRN

## 2013-07-24 NOTE — Progress Notes (Signed)
Subjective:    Patient ID: Derek Sherman, male    DOB: 06/01/1960, 53 y.o.   MRN: [id number]  HPI: [name] Genna is a 53 year old male who returns for follow up for chronic pain and medication refill. He says his pain is in his lower back. He rates his pain 8. His current exercise regime is aquatic therapy twice a week.   He arrived to clinic hypertensive, he says he didn't take his anti-hypertensive's this morning. Educated on compliance and he verbalized understanding. He refuses to go to the emergency room, he says he will take his medications when he goes home.  Pain Inventory Average Pain 9 Pain Right Now 8 My pain is constant, sharp, stabbing and aching  In the last 24 hours, has pain interfered with the following? General activity 8 Relation with others 8 Enjoyment of life 9 What TIME of day is your pain at its worst? constant all day Sleep (in general) Fair  Pain is worse with: walking, sitting and standing Pain improves with: rest and medication Relief from Meds: 0  Mobility walk without assistance ability to climb steps?  yes do you drive?  yes transfers alone Do you have any goals in this area?  no  Function disabled: date disabled 08/2004 Do you have any goals in this area?  yes  Neuro/Psych No problems in this area  Prior Studies Any changes since last visit?  no  Physicians involved in your care Any changes since last visit?  no   Family History  Problem Relation Age of Onset  . Hypertension Mother   . Hypertension Father    History   Social History  . Marital Status: Married    Spouse Name: N/A    Number of Children: N/A  . Years of Education: N/A   Social History Main Topics  . Smoking status: Current Every Day Smoker -- 28 years    Types: Cigarettes  . Smokeless tobacco: Never Used     Comment: has been 1 week without smoking (using electronic)  . Alcohol Use: None  . Drug Use: None  . Sexual Activity: None   Other Topics Concern    . None   Social History Narrative  . None   Past Surgical History  Procedure Laterality Date  . Spine surgery    . Coronary artery bypass graft    . Tonsilectomy, adenoidectomy, bilateral myringotomy and tubes     Past Medical History  Diagnosis Date  . Depression   . Hyperlipidemia   . Hypertension    BP 193/105  Pulse 85  Resp 14  Ht 5\' 9"  (1.753 m)  Wt 220 lb (99.791 kg)  BMI 32.47 kg/m2  SpO2 97%  Opioid Risk Score:   Fall Risk Score: Low Fall Risk (0-5 points) (pt educated on fall risk, brochure given to pt previously)   Review of Systems  Musculoskeletal: Positive for back pain.  All other systems reviewed and are negative.      Objective:   Physical Exam  Nursing note and vitals reviewed. Constitutional: He is oriented to person, place, and time. He appears well-developed and well-nourished.  HENT:  Head: Normocephalic and atraumatic.  Neck: Normal range of motion. Neck supple.  Cardiovascular: Normal rate and regular rhythm.   Pulmonary/Chest: Effort normal and breath sounds normal.  Musculoskeletal: Normal range of motion.  Normal Muscle Bulk: Muscle Testing Reveals: Upper Extremities: Right Upper Arm: Full ROM Muscle Strength 5/5. Left Arm: Full ROM and Muscle  Strength 4/5. Back without spinal or paraspinal Tenderness noted. Spine Flexion 45 degrees Lower Extremities: Full ROM and Muscle Strength 5/5. Arises from chair with ease. Narrow based gait  Neurological: He is alert and oriented to person, place, and time.  Skin: Skin is warm and dry.  Psychiatric: He has a normal mood and affect.          Assessment & Plan:  1. Chronic Low back Pain: Refilled: Norco 10/325 mg one tablet every 6 hours # 120. Continue Aquatic Therapy twice a week. ( Today's script discarded).  15 minutes of face to face patient care time was spent during this visit. All questions were encouraged and answered.  F/U in 1 month

## 2013-08-16 ENCOUNTER — Encounter: Payer: Self-pay | Admitting: Registered Nurse

## 2013-08-16 ENCOUNTER — Encounter: Payer: Self-pay | Attending: Registered Nurse | Admitting: Registered Nurse

## 2013-08-16 VITALS — BP 191/109 | HR 86 | Resp 14 | Ht 68.0 in | Wt 220.0 lb

## 2013-08-16 DIAGNOSIS — F329 Major depressive disorder, single episode, unspecified: Secondary | ICD-10-CM | POA: Insufficient documentation

## 2013-08-16 DIAGNOSIS — M961 Postlaminectomy syndrome, not elsewhere classified: Secondary | ICD-10-CM | POA: Insufficient documentation

## 2013-08-16 DIAGNOSIS — Z8673 Personal history of transient ischemic attack (TIA), and cerebral infarction without residual deficits: Secondary | ICD-10-CM | POA: Insufficient documentation

## 2013-08-16 DIAGNOSIS — Z951 Presence of aortocoronary bypass graft: Secondary | ICD-10-CM | POA: Insufficient documentation

## 2013-08-16 DIAGNOSIS — M545 Low back pain, unspecified: Secondary | ICD-10-CM | POA: Insufficient documentation

## 2013-08-16 DIAGNOSIS — I10 Essential (primary) hypertension: Secondary | ICD-10-CM | POA: Insufficient documentation

## 2013-08-16 DIAGNOSIS — F3289 Other specified depressive episodes: Secondary | ICD-10-CM | POA: Insufficient documentation

## 2013-08-16 DIAGNOSIS — Z79899 Other long term (current) drug therapy: Secondary | ICD-10-CM

## 2013-08-16 DIAGNOSIS — E785 Hyperlipidemia, unspecified: Secondary | ICD-10-CM | POA: Insufficient documentation

## 2013-08-16 DIAGNOSIS — F17200 Nicotine dependence, unspecified, uncomplicated: Secondary | ICD-10-CM | POA: Insufficient documentation

## 2013-08-16 DIAGNOSIS — G8929 Other chronic pain: Secondary | ICD-10-CM | POA: Insufficient documentation

## 2013-08-16 DIAGNOSIS — Z5181 Encounter for therapeutic drug level monitoring: Secondary | ICD-10-CM

## 2013-08-16 MED ORDER — HYDROCODONE-ACETAMINOPHEN 10-325 MG PO TABS: 1.0000 | ORAL_TABLET | Freq: Four times a day (QID) | ORAL | Status: DC | PRN

## 2013-08-16 NOTE — Progress Notes (Signed)
Subjective:    Patient ID: Derek Sherman, male    DOB: October 10, 1960, 53 y.o.   MRN: [id number]  HPI: Derek Sherman is a 53 year old male who returns for follow up for chronic pain and medication refill. He says his pain is located in her lower back.He rates his pain 8. His current exercise regime is walking and Aquatic exercises at the [facility]. He goes to the [facility] twice a week and walks daily. He arrived to the office hypertensive again, he says this time I have a reason I had an argument with my wife. His blood pressure was rechecked 179/104. Derek Sherman was educated on his blood pressure the last three months it has been high and he always says he has a reason. He says he took his antihypertensives this morning. He refuses to go to [employer]. He has been instructed to check his blood pressure three times a day and to follow up with his PMD. He verbalizes understanding. Pain Inventory Average Pain 8 Pain Right Now 8 My pain is constant, sharp, stabbing and aching  In the last 24 hours, has pain interfered with the following? General activity 7 Relation with others 7 Enjoyment of life 8 What TIME of day is your pain at its worst? daytime, evening, night Sleep (in general) Poor  Pain is worse with: walking, sitting and standing Pain improves with: rest and medication Relief from Meds: 3  Mobility walk without assistance ability to climb steps?  yes do you drive?  yes transfers alone Do you have any goals in this area?  no  Function disabled: date disabled na Do you have any goals in this area?  no  Neuro/Psych No problems in this area  Prior Studies Any changes since last visit?  no  Physicians involved in your care Any changes since last visit?  no   Family History  Problem Relation Age of Onset  . Hypertension Mother   . Hypertension Father    History   Social History  . Marital Status: Married    Spouse Name: N/A    Number of Children: N/A  . Years of Education:  N/A   Social History Main Topics  . Smoking status: Current Every Day Smoker -- 28 years    Types: Cigarettes  . Smokeless tobacco: Never Used     Comment: has been 1 week without smoking (using electronic)  . Alcohol Use: None  . Drug Use: None  . Sexual Activity: None   Other Topics Concern  . None   Social History Narrative  . None   Past Surgical History  Procedure Laterality Date  . Spine surgery    . Coronary artery bypass graft    . Tonsilectomy, adenoidectomy, bilateral myringotomy and tubes     Past Medical History  Diagnosis Date  . Depression   . Hyperlipidemia   . Hypertension    BP 191/109  Pulse 86  Resp 14  Ht 5\' 8"  (1.727 m)  Wt 220 lb (99.791 kg)  BMI 33.46 kg/m2  SpO2 99%  Opioid Risk Score:   Fall Risk Score: Low Fall Risk (0-5 points) (pt educated on fall risk, brochure given to pt previously)    Review of Systems  Musculoskeletal: Positive for back pain.  All other systems reviewed and are negative.      Objective:   Physical Exam  Nursing note and vitals reviewed. Constitutional: He is oriented to person, place, and time. He appears well-developed and well-nourished.  HENT:  Head: Normocephalic and atraumatic.  Neck: Normal range of motion. Neck supple.  Cardiovascular: Normal rate and regular rhythm.   Pulmonary/Chest: Effort normal and breath sounds normal.  Musculoskeletal:  Normal Muscle Bulk and Muscle testing Reveals: Upper Extremities: Full ROM and Muscle Strength on the Right 5/5 and Left 4/5. Spine forward flexion 45 degrees Back without spinal or paraspinal tenderness noted. Lower Extremities: Full ROM and Muscle Strength 5/5 Arises from chair with ease. Narrow Based Gait  Neurological: He is alert and oriented to person, place, and time.  Skin: Skin is warm and dry.  Psychiatric: He has a normal mood and affect.          Assessment & Plan:  1.Chronic Low back Pain: Refilled: Norco 10/325 mg one tablet every 6  hours # 120. Continue Aquatic Therapy twice a week.   20 minutes of face to face patient care time was spent during this visit. All questions were encouraged and answered.  F/U in 1 month

## 2013-09-13 ENCOUNTER — Encounter: Payer: Self-pay | Admitting: Registered Nurse

## 2013-09-13 ENCOUNTER — Encounter: Payer: Self-pay | Attending: Registered Nurse | Admitting: Registered Nurse

## 2013-09-13 VITALS — BP 169/79 | HR 78 | Resp 14 | Ht 69.0 in | Wt 227.0 lb

## 2013-09-13 DIAGNOSIS — M961 Postlaminectomy syndrome, not elsewhere classified: Secondary | ICD-10-CM

## 2013-09-13 DIAGNOSIS — M545 Low back pain, unspecified: Secondary | ICD-10-CM

## 2013-09-13 DIAGNOSIS — I10 Essential (primary) hypertension: Secondary | ICD-10-CM | POA: Insufficient documentation

## 2013-09-13 DIAGNOSIS — F3289 Other specified depressive episodes: Secondary | ICD-10-CM | POA: Insufficient documentation

## 2013-09-13 DIAGNOSIS — E785 Hyperlipidemia, unspecified: Secondary | ICD-10-CM | POA: Insufficient documentation

## 2013-09-13 DIAGNOSIS — G8929 Other chronic pain: Secondary | ICD-10-CM

## 2013-09-13 DIAGNOSIS — F329 Major depressive disorder, single episode, unspecified: Secondary | ICD-10-CM | POA: Insufficient documentation

## 2013-09-13 DIAGNOSIS — Z951 Presence of aortocoronary bypass graft: Secondary | ICD-10-CM | POA: Insufficient documentation

## 2013-09-13 DIAGNOSIS — Z8673 Personal history of transient ischemic attack (TIA), and cerebral infarction without residual deficits: Secondary | ICD-10-CM | POA: Insufficient documentation

## 2013-09-13 DIAGNOSIS — Z5181 Encounter for therapeutic drug level monitoring: Secondary | ICD-10-CM

## 2013-09-13 DIAGNOSIS — Z79899 Other long term (current) drug therapy: Secondary | ICD-10-CM

## 2013-09-13 DIAGNOSIS — F17200 Nicotine dependence, unspecified, uncomplicated: Secondary | ICD-10-CM | POA: Insufficient documentation

## 2013-09-13 MED ORDER — HYDROCODONE-ACETAMINOPHEN 10-325 MG PO TABS: 1.0000 | ORAL_TABLET | Freq: Four times a day (QID) | ORAL | Status: DC | PRN

## 2013-09-13 NOTE — Progress Notes (Signed)
Subjective:    Patient ID: Derek Sherman, male    DOB: 02/27/1961, 53 y.o.   MRN: [id number] [name] is a 53 year old male who returns for follow up for chronic pain and medication refill. He says his pain is located in her lower back.He rates his pain 8. His current exercise regime is walking and [facility]. [facility] and walks daily with his children. He says he probably will have to cut back on going to aquatic exercises since the children are out of school. His blood pressure was controlled today. He has been called for jury duty doesn't think he will be able to sit for long period of time. Will speak to Dr. Wynn BankerKirsteins about writing a letter on his behalf. He verbalizes understanding.  HPI:  Pain Inventory Average Pain 8 Pain Right Now 8 My pain is constant, sharp, stabbing and aching  In the last 24 hours, has pain interfered with the following? General activity 8 Relation with others 8 Enjoyment of life 8 What TIME of day is your pain at its worst? constant all day Sleep (in general) Poor  Pain is worse with: walking, sitting and standing Pain improves with: rest and medication Relief from Meds: 3  Mobility walk without assistance ability to climb steps?  yes do you drive?  yes transfers alone Do you have any goals in this area?  no  Function disabled: date disabled na Do you have any goals in this area?  no  Neuro/Psych No problems in this area  Prior Studies Any changes since last visit?  no  Physicians involved in your care Any changes since last visit?  no   Family History  Problem Relation Age of Onset  . Hypertension Mother   . Hypertension Father    History   Social History  . Marital Status: Married    Spouse Name: N/A    Number of Children: N/A  . Years of Education: N/A   Social History Main Topics  . Smoking status: Current Every Day Smoker -- 28 years    Types: Cigarettes  . Smokeless  tobacco: Never Used     Comment: has been 1 week without smoking (using electronic)  . Alcohol Use: None  . Drug Use: None  . Sexual Activity: None   Other Topics Concern  . None   Social History Narrative  . None   Past Surgical History  Procedure Laterality Date  . Spine surgery    . Coronary artery bypass graft    . Tonsilectomy, adenoidectomy, bilateral myringotomy and tubes     Past Medical History  Diagnosis Date  . Depression   . Hyperlipidemia   . Hypertension    BP 169/79  Pulse 78  Resp 14  Ht 5\' 9"  (1.753 m)  Wt 227 lb (102.967 kg)  BMI 33.51 kg/m2  SpO2 95%  Opioid Risk Score:   Fall Risk Score: Low Fall Risk (0-5 points) (pt educated on fall risk, brochure given to pt previously)    Review of Systems  Musculoskeletal: Positive for back pain.  All other systems reviewed and are negative.      Objective:   Physical Exam  Nursing note and vitals reviewed. Constitutional: He is oriented to person, place, and time. He appears well-developed and well-nourished.  HENT:  Head: Normocephalic and atraumatic.  Neck: Normal range of motion. Neck supple.  Cardiovascular: Normal rate and regular rhythm.  Pulmonary/Chest: Effort normal and breath sounds normal.  Musculoskeletal:  Normal Muscle Bulk and Muscle Testing Reveals: Upper and Lower extremities with Full ROM and Muscle strength on the right 5/5 and left 4/5 Lumbar Paraspinal tenderness: L-3- L-5 Arises from chair with ease Narrow based gait  Neurological: He is oriented to person, place, and time. He has normal reflexes.  Skin: Skin is warm and dry.  Psychiatric: He has a normal mood and affect.          Assessment & Plan:  1.Chronic Low back Pain: Refilled: Norco 10/325 mg one tablet every 6 hours # 120. Continue Aquatic Therapy twice a week. Continue with exercise and heat therapy.  20 minutes of face to face patient care time was spent during this visit. All questions were encouraged  and answered.   F/U in 1 month

## 2013-09-25 ENCOUNTER — Telehealth: Payer: Self-pay

## 2013-09-25 NOTE — Telephone Encounter (Signed)
Patient is requesting a letter to be excused from jury duty. Patient states he spoke to [place]unice about this at his least OV.

## 2013-09-26 NOTE — Telephone Encounter (Signed)
Jury duty Public house managerletter printed.  Left message advising patient.  Letter to the front for pick up.

## 2013-09-27 ENCOUNTER — Telehealth: Payer: Self-pay

## 2013-09-27 NOTE — Telephone Encounter (Signed)
Patient called to let us know to go ahead and mail him his jury duty letter.  Letter mailed.

## 2013-10-12 ENCOUNTER — Encounter (HOSPITAL_BASED_OUTPATIENT_CLINIC_OR_DEPARTMENT_OTHER): Payer: Self-pay | Admitting: Registered Nurse

## 2013-10-12 ENCOUNTER — Encounter: Payer: Self-pay | Admitting: Registered Nurse

## 2013-10-12 VITALS — BP 131/77 | HR 67 | Resp 14 | Wt 226.0 lb

## 2013-10-12 DIAGNOSIS — M545 Low back pain, unspecified: Secondary | ICD-10-CM

## 2013-10-12 DIAGNOSIS — Z79899 Other long term (current) drug therapy: Secondary | ICD-10-CM

## 2013-10-12 DIAGNOSIS — M961 Postlaminectomy syndrome, not elsewhere classified: Secondary | ICD-10-CM

## 2013-10-12 DIAGNOSIS — G8929 Other chronic pain: Secondary | ICD-10-CM

## 2013-10-12 DIAGNOSIS — Z5181 Encounter for therapeutic drug level monitoring: Secondary | ICD-10-CM

## 2013-10-12 MED ORDER — HYDROCODONE-ACETAMINOPHEN 10-325 MG PO TABS: 1.0000 | ORAL_TABLET | Freq: Four times a day (QID) | ORAL | Status: DC | PRN

## 2013-10-12 NOTE — Progress Notes (Signed)
Subjective:    Patient ID: Derek Sherman, male    DOB: [birth year], 53 y.o.   MRN: [id number]  HPI: [name] is a 53 year old male who returns for follow up for chronic pain and medication refill. He says his pain is located in her lower back. He has noticed at times his back pain radiates to his left buttock. He rates his pain 8. His current exercise regime is performing Aquatic exercises at the [facility]. He goes to the [facility] twice a week.   Pain Inventory Average Pain 8 Pain Right Now 8 My pain is sharp, stabbing and aching  In the last 24 hours, has pain interfered with the following? General activity 8 Relation with others 8 Enjoyment of life 8 What TIME of day is your pain at its worst? all Sleep (in general) Fair  Pain is worse with: walking, sitting and standing Pain improves with: rest, therapy/exercise and medication Relief from Meds: 0  Mobility ability to climb steps?  yes do you drive?  yes  Function disabled: date disabled 06/06 Do you have any goals in this area?  yes  Neuro/Psych No problems in this area  Prior Studies Any changes since last visit?  no  Physicians involved in your care Any changes since last visit?  no   Family History  Problem Relation Age of Onset  . Hypertension Mother   . Hypertension Father    History   Social History  . Marital Status: Married    Spouse Name: N/A    Number of Children: N/A  . Years of Education: N/A   Social History Main Topics  . Smoking status: Current Every Day Smoker -- 28 years    Types: Cigarettes  . Smokeless tobacco: Never Used     Comment: has been 1 week without smoking (using electronic)  . Alcohol Use: None  . Drug Use: None  . Sexual Activity: None   Other Topics Concern  . None   Social History Narrative  . None   Past Surgical History  Procedure Laterality Date  . Spine surgery    . Coronary artery bypass graft    . Tonsilectomy, adenoidectomy, bilateral myringotomy and  tubes     Past Medical History  Diagnosis Date  . Depression   . Hyperlipidemia   . Hypertension    BP 131/77  Pulse 67  Resp 14  Wt 226 lb (102.513 kg)  SpO2 97%  Opioid Risk Score:   Fall Risk Score: Low Fall Risk (0-5 points) (previously educated and given handout )   Review of Systems  Musculoskeletal: Positive for back pain.  All other systems reviewed and are negative.      Objective:   Physical Exam  Nursing note and vitals reviewed. Constitutional: He is oriented to person, place, and time. He appears well-developed and well-nourished.  HENT:  Head: Normocephalic and atraumatic.  Neck: Normal range of motion. Neck supple.  Cardiovascular: Normal rate, regular rhythm and normal heart sounds.   Pulmonary/Chest: Effort normal and breath sounds normal.  Musculoskeletal:  Normal Muscle Bulk and Muscle testing Reveals: Upper Extremities: Full ROM and Muscle Strength on the Right 5/5 and Left 4/5. Spinal Forward Flexion 80 Degrees and extension 10 degrees Lower Extremities: Full ROM and Muscle strength 5/5 Arises from chair with ease Narrow Based gait.  Neurological: He is alert and oriented to person, place, and time.  Skin: Skin is warm and dry.  Psychiatric: He has a normal mood and  affect.          Assessment & Plan:  1.Chronic Low back Pain: Refilled: Hydrocodone 10/325 mg one tablet every 6 hours # 120. Continue Aquatic Therapy twice a week. Continue with exercise and heat therapy.   15 minutes of face to face patient care time was spent during this visit. All questions were encouraged and answered.   F/U in 1 month

## 2013-11-13 ENCOUNTER — Ambulatory Visit (HOSPITAL_BASED_OUTPATIENT_CLINIC_OR_DEPARTMENT_OTHER): Payer: Self-pay | Admitting: Physical Medicine & Rehabilitation

## 2013-11-13 ENCOUNTER — Encounter: Payer: Self-pay | Attending: Physical Medicine & Rehabilitation

## 2013-11-13 ENCOUNTER — Encounter: Payer: Self-pay | Admitting: Physical Medicine & Rehabilitation

## 2013-11-13 VITALS — BP 129/69 | HR 65 | Resp 14 | Ht 68.0 in | Wt 222.0 lb

## 2013-11-13 DIAGNOSIS — M961 Postlaminectomy syndrome, not elsewhere classified: Secondary | ICD-10-CM | POA: Insufficient documentation

## 2013-11-13 DIAGNOSIS — M24559 Contracture, unspecified hip: Secondary | ICD-10-CM

## 2013-11-13 DIAGNOSIS — I10 Essential (primary) hypertension: Secondary | ICD-10-CM | POA: Insufficient documentation

## 2013-11-13 DIAGNOSIS — M47817 Spondylosis without myelopathy or radiculopathy, lumbosacral region: Secondary | ICD-10-CM

## 2013-11-13 DIAGNOSIS — F17200 Nicotine dependence, unspecified, uncomplicated: Secondary | ICD-10-CM | POA: Insufficient documentation

## 2013-11-13 DIAGNOSIS — E785 Hyperlipidemia, unspecified: Secondary | ICD-10-CM | POA: Insufficient documentation

## 2013-11-13 MED ORDER — HYDROCODONE-ACETAMINOPHEN 10-325 MG PO TABS: 1.0000 | ORAL_TABLET | Freq: Four times a day (QID) | ORAL | Status: DC | PRN

## 2013-11-13 NOTE — Progress Notes (Signed)
Subjective:    Patient ID: Derek Sherman, male    DOB: March 31, 1960, 53 y.o.   MRN: [id number]  HPI  [name] is a 53 year old male who returns for follow up for chronic pain and medication refill. He says his pain is located in his lower back. He has noticed at times his back pain radiates to his left buttock. He rates his pain 8. His current exercise regime is performing Aquatic exercises at the [facility]. He goes to the [facility] 2-3 times a week.  No new medication changes from other doctors. No surgeries or other procedures in the interval time.   Pain Inventory Average Pain 9 Pain Right Now 9 My pain is constant  In the last 24 hours, has pain interfered with the following? General activity 8 Relation with others 8 Enjoyment of life 8 What TIME of day is your pain at its worst? constant all day Sleep (in general) Poor  Pain is worse with: walking, sitting and standing Pain improves with: rest, therapy/exercise and medication Relief from Meds: 3  Mobility walk without assistance do you drive?  yes transfers alone Do you have any goals in this area?  no  Function disabled: date disabled 08/2004 Do you have any goals in this area?  yes  Neuro/Psych No problems in this area  Prior Studies Any changes since last visit?  no  Physicians involved in your care Any changes since last visit?  no   Family History  Problem Relation Age of Onset  . Hypertension Mother   . Hypertension Father    History   Social History  . Marital Status: Married    Spouse Name: N/A    Number of Children: N/A  . Years of Education: N/A   Social History Main Topics  . Smoking status: Current Every Day Smoker -- 28 years    Types: Cigarettes  . Smokeless tobacco: Never Used     Comment: has been 1 week without smoking (using electronic)  . Alcohol Use: None  . Drug Use: None  . Sexual Activity: None   Other Topics Concern  . None   Social History Narrative  . None   Past  Surgical History  Procedure Laterality Date  . Spine surgery    . Coronary artery bypass graft    . Tonsilectomy, adenoidectomy, bilateral myringotomy and tubes     Past Medical History  Diagnosis Date  . Depression   . Hyperlipidemia   . Hypertension    BP 129/69  Pulse 65  Resp 14  Ht  (1.727 m)  Wt 222 lb (100.699 kg)  BMI 33.76 kg/m2  SpO2 97%  Opioid Risk Score:   Fall Risk Score:      Review of Systems  Musculoskeletal: Positive for back pain.  All other systems reviewed and are negative.      Objective:   Physical Exam  Nursing note and vitals reviewed. Constitutional: He appears well-developed and well-nourished.  HENT:  Head: Atraumatic.  Eyes: Conjunctivae and EOM are normal.  Neck: Normal range of motion.  Musculoskeletal:       Lumbar back: He exhibits decreased range of motion and tenderness.  Negative straight leg raising test Reduced lumbar range of motion 75% forward flexion less than 25% extension and lateral bending  Neurological: He is alert. He has normal strength.  Reflex Scores:      Patellar reflexes are 2+ on the right side and 2+ on the left side.  Achilles reflexes are 2+ on the right side and 2+ on the left side. Psychiatric: He has a normal mood and affect.          Assessment & Plan:  1. Lumbago. Patient would like to come every 2 months however we discussed that this would require a change in medications to a schedule 3 medication. For now he like to continue to hydrocodone as prescribed. 10 mg 4 times a day with monthly visits for medication monitoring 2. Status post L5-S1 Depuy total disk replacement.  3. Neurogenic claudication, symptoms have improved in this regard.  4. 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 Recommend continued exercise for range of motion

## 2013-11-13 NOTE — Patient Instructions (Signed)
To have less freq visits would need to change to tylenol #4

## 2013-12-10 ENCOUNTER — Ambulatory Visit (HOSPITAL_BASED_OUTPATIENT_CLINIC_OR_DEPARTMENT_OTHER): Payer: Self-pay | Admitting: Physical Medicine & Rehabilitation

## 2013-12-10 ENCOUNTER — Encounter: Payer: Self-pay | Admitting: Physical Medicine & Rehabilitation

## 2013-12-10 VITALS — BP 113/68 | HR 67 | Resp 14 | Ht 68.0 in | Wt 221.0 lb

## 2013-12-10 DIAGNOSIS — Z5181 Encounter for therapeutic drug level monitoring: Secondary | ICD-10-CM

## 2013-12-10 DIAGNOSIS — Z79899 Other long term (current) drug therapy: Secondary | ICD-10-CM

## 2013-12-10 DIAGNOSIS — M47817 Spondylosis without myelopathy or radiculopathy, lumbosacral region: Secondary | ICD-10-CM

## 2013-12-10 DIAGNOSIS — M961 Postlaminectomy syndrome, not elsewhere classified: Secondary | ICD-10-CM

## 2013-12-10 MED ORDER — HYDROCODONE-ACETAMINOPHEN 10-325 MG PO TABS: 1.0000 | ORAL_TABLET | Freq: Four times a day (QID) | ORAL | Status: DC | PRN

## 2013-12-10 NOTE — Patient Instructions (Signed)
Continue aquatic exercise program. Increased reps about 10% weekly

## 2013-12-10 NOTE — Progress Notes (Signed)
Subjective:    Patient ID: Derek Sherman, male    DOB: [birth year], 53 y.o.   MRN: [id number] 53 year old male who returns for follow up for chronic pain and medication refill. He says his pain is located in his lower back. He has noticed at times his back pain radiates to his left buttock. He rates his pain 8. His current exercise regime is performing Aquatic exercises at the [facility]. He goes to the [facility] 2-3 times a week  HPI  Doing hip exercises in water  No new claudication sx Pain Inventory Average Pain 8 Pain Right Now 8 My pain is constant, sharp, stabbing and aching  In the last 24 hours, has pain interfered with the following? General activity 8 Relation with others 8 Enjoyment of life 8 What TIME of day is your pain at its worst? morning, daytime, evening, night Sleep (in general) Poor  Pain is worse with: walking, sitting and standing Pain improves with: rest, therapy/exercise and medication Relief from Meds: 2  Mobility walk without assistance  Function disabled: date disabled 2006  Neuro/Psych No problems in this area  Prior Studies Any changes since last visit?  no  Physicians involved in your care Any changes since last visit?  no   Family History  Problem Relation Age of Onset  . Hypertension Mother   . Hypertension Father    History   Social History  . Marital Status: Married    Spouse Name: N/A    Number of Children: N/A  . Years of Education: N/A   Social History Main Topics  . Smoking status: Current Every Day Smoker -- 28 years    Types: Cigarettes  . Smokeless tobacco: Never Used     Comment: has been 1 week without smoking (using electronic)  . Alcohol Use: None  . Drug Use: None  . Sexual Activity: None   Other Topics Concern  . None   Social History Narrative  . None   Past Surgical History  Procedure Laterality Date  . Spine surgery    . Coronary artery bypass graft    . Tonsilectomy, adenoidectomy, bilateral myringotomy  and tubes     Past Medical History  Diagnosis Date  . Depression   . Hyperlipidemia   . Hypertension    BP 113/68  Pulse 67  Resp 14  Ht  (1.727 m)  Wt 221 lb (100.245 kg)  BMI 33.61 kg/m2  SpO2 97%  Opioid Risk Score:   Fall Risk Score: Low Fall Risk (0-5 points)   Review of Systems     Objective:   Physical Exam   Constitutional: He appears well-developed and well-nourished.  HENT:  Head: Atraumatic.  Eyes: Conjunctivae and EOM are normal.  Neck: Normal range of motion.  Musculoskeletal:  Lumbar back: He exhibits decreased range of motion and tenderness.  Negative straight leg raising test Reduced lumbar range of motion 75% forward flexion less than 25% extension and lateral bending  No calf tenderness to palpation  Neurological: He is alert. He has normal strength.  Reflex Scores:  Patellar reflexes are 2+ on the right side and 2+ on the left side.  Achilles reflexes are 2+ on the right side and 2+ on the left side. Psychiatric: He has a normal mood and affect.  Reduced hip internal/external rotation bilateral     Assessment & Plan:  Lumbago. Patient would like to come every 2 months however we discussed that this would require a change in medications  to a schedule 3 medication. For now he like to continue to hydrocodone as prescribed. 10 mg 4 times a day with monthly visits for medication monitoring  2. Status post L5-S1 Depuy total disk replacement.  3. Neurogenic claudication, symptoms have improved in this regard.  4. 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 Recommend continued exercise for range of motion

## 2014-01-07 ENCOUNTER — Encounter: Payer: Self-pay | Attending: Physical Medicine & Rehabilitation | Admitting: Registered Nurse

## 2014-01-07 ENCOUNTER — Encounter: Payer: Self-pay | Admitting: Registered Nurse

## 2014-01-07 VITALS — BP 114/66 | HR 64 | Resp 14 | Ht 68.0 in | Wt 223.0 lb

## 2014-01-07 DIAGNOSIS — E785 Hyperlipidemia, unspecified: Secondary | ICD-10-CM | POA: Insufficient documentation

## 2014-01-07 DIAGNOSIS — Z760 Encounter for issue of repeat prescription: Secondary | ICD-10-CM | POA: Insufficient documentation

## 2014-01-07 DIAGNOSIS — Z79899 Other long term (current) drug therapy: Secondary | ICD-10-CM

## 2014-01-07 DIAGNOSIS — M961 Postlaminectomy syndrome, not elsewhere classified: Secondary | ICD-10-CM

## 2014-01-07 DIAGNOSIS — I10 Essential (primary) hypertension: Secondary | ICD-10-CM | POA: Insufficient documentation

## 2014-01-07 DIAGNOSIS — F329 Major depressive disorder, single episode, unspecified: Secondary | ICD-10-CM | POA: Insufficient documentation

## 2014-01-07 DIAGNOSIS — M545 Low back pain, unspecified: Secondary | ICD-10-CM

## 2014-01-07 DIAGNOSIS — G8929 Other chronic pain: Secondary | ICD-10-CM

## 2014-01-07 DIAGNOSIS — F17210 Nicotine dependence, cigarettes, uncomplicated: Secondary | ICD-10-CM | POA: Insufficient documentation

## 2014-01-07 DIAGNOSIS — Z5181 Encounter for therapeutic drug level monitoring: Secondary | ICD-10-CM

## 2014-01-07 MED ORDER — HYDROCODONE-ACETAMINOPHEN 10-325 MG PO TABS: 1.0000 | ORAL_TABLET | Freq: Four times a day (QID) | ORAL | Status: DC | PRN

## 2014-01-07 NOTE — Progress Notes (Signed)
Subjective:    Patient ID: Derek Sherman, male    DOB: 07/08/1960, 53 y.o.   MRN: [id number]  HPI: Derek Sherman is a 53 year old male who returns for follow up for chronic pain and medication refill. He says his pain is located in his lower back.  He rates his pain 8. His current [facility] regime is [facility].  Pain Inventory Average Pain 9 Pain Right Now 8 My pain is constant, sharp, stabbing and aching  In the last 24 hours, has pain interfered with the following? General activity 8 Relation with others 8 Enjoyment of life 8 What TIME of day is your pain at its worst? ALL Sleep (in general) Poor  Pain is worse with: walking and standing Pain improves with: rest, therapy/exercise and medication Relief from Meds: 0  Mobility walk without assistance ability to climb steps?  yes do you drive?  yes  Function disabled: date disabled 08/2004  Neuro/Psych No problems in this area  Prior Studies Any changes since last visit?  no  Physicians involved in your care Any changes since last visit?  no   Family History  Problem Relation Age of Onset  . Hypertension Mother   . Hypertension Father    History   Social History  . Marital Status: Married    Spouse Name: N/A    Number of Children: N/A  . Years of Education: N/A   Social History Main Topics  . Smoking status: Current Every Day Smoker -- 28 years    Types: Cigarettes  . Smokeless tobacco: Never Used     Comment: has been 1 week without smoking (using electronic)  . Alcohol Use: None  . Drug Use: None  . Sexual Activity: None   Other Topics Concern  . None   Social History Narrative  . None   Past Surgical History  Procedure Laterality Date  . Spine surgery    . Coronary artery bypass graft    . Tonsilectomy, adenoidectomy, bilateral myringotomy and tubes     Past Medical History  Diagnosis Date  . Depression   .  Hyperlipidemia   . Hypertension    BP 114/66  Pulse 64  Resp 14  Ht 5\' 8"  (1.727 m)  Wt 223 lb (101.152 kg)  BMI 33.91 kg/m2  SpO2 98%  Opioid Risk Score:   Fall Risk Score:    Review of Systems     Objective:   Physical Exam  Nursing note and vitals reviewed. Constitutional: He is oriented to person, place, and time. He appears well-developed and well-nourished.  HENT:  Head: Normocephalic and atraumatic.  Neck: Normal range of motion. Neck supple.  Cardiovascular: Normal rate and regular rhythm.   Pulmonary/Chest: Effort normal and breath sounds normal.  Musculoskeletal:  Normal Muscle Bulk and Muscle Testing Reveals: Upper Extremities: Full ROM and Muscle strength 5/5 Lumbar Paraspinal Tenderness: L-3- L-5 Lower Extremities: Full ROM and Muscle Strength 5/5 Arises from chair with ease Narrow Based Gait  Neurological: He is alert and oriented to person, place, and time.  Skin: Skin is warm and dry.  Psychiatric: He has a normal mood and affect.          Assessment & Plan:  1.Chronic Low back Pain: Refilled: Hydrocodone 10/325 mg one tablet every 6 hours # 120. Continue Aquatic Therapy twice a week. Continue with exercise and heat therapy.  15 minutes of face  to face patient care time was spent during this visit. All questions were encouraged and answered.  F/U in 1 month

## 2014-01-30 ENCOUNTER — Encounter: Payer: Self-pay | Admitting: Registered Nurse

## 2014-02-04 ENCOUNTER — Ambulatory Visit (HOSPITAL_BASED_OUTPATIENT_CLINIC_OR_DEPARTMENT_OTHER): Payer: Self-pay | Admitting: Physical Medicine & Rehabilitation

## 2014-02-04 ENCOUNTER — Encounter: Payer: Self-pay | Admitting: Physical Medicine & Rehabilitation

## 2014-02-04 ENCOUNTER — Encounter: Payer: Self-pay | Attending: Physical Medicine & Rehabilitation

## 2014-02-04 VITALS — BP 110/70 | HR 68 | Resp 14 | Ht 68.0 in | Wt 225.0 lb

## 2014-02-04 DIAGNOSIS — M24559 Contracture, unspecified hip: Secondary | ICD-10-CM

## 2014-02-04 DIAGNOSIS — M545 Low back pain: Secondary | ICD-10-CM

## 2014-02-04 DIAGNOSIS — G8929 Other chronic pain: Secondary | ICD-10-CM | POA: Insufficient documentation

## 2014-02-04 DIAGNOSIS — F329 Major depressive disorder, single episode, unspecified: Secondary | ICD-10-CM | POA: Insufficient documentation

## 2014-02-04 DIAGNOSIS — E785 Hyperlipidemia, unspecified: Secondary | ICD-10-CM | POA: Insufficient documentation

## 2014-02-04 DIAGNOSIS — M961 Postlaminectomy syndrome, not elsewhere classified: Secondary | ICD-10-CM

## 2014-02-04 DIAGNOSIS — F17210 Nicotine dependence, cigarettes, uncomplicated: Secondary | ICD-10-CM | POA: Insufficient documentation

## 2014-02-04 DIAGNOSIS — Z760 Encounter for issue of repeat prescription: Secondary | ICD-10-CM | POA: Insufficient documentation

## 2014-02-04 DIAGNOSIS — I10 Essential (primary) hypertension: Secondary | ICD-10-CM | POA: Insufficient documentation

## 2014-02-04 MED ORDER — ACETAMINOPHEN-CODEINE 300-60 MG PO TABS: 1.0000 | ORAL_TABLET | ORAL | Status: DC | PRN

## 2014-02-04 MED ORDER — HYDROCODONE-ACETAMINOPHEN 10-325 MG PO TABS: 1.0000 | ORAL_TABLET | Freq: Four times a day (QID) | ORAL | Status: DC | PRN

## 2014-02-04 NOTE — Progress Notes (Signed)
Subjective:    Patient ID: Derek Sherman, male    DOB: 07/25/1960, 53 y.o.   MRN: [id number]  HPI Doing well with Aquatic therapy now up to 3 days per week Wants to come off narcotic if possible Discussed trial of tylenol#4   Pain Inventory Average Pain 8 Pain Right Now 8 My pain is constant, sharp, stabbing and aching  In the last 24 hours, has pain interfered with the following? General activity 8 Relation with others 8 Enjoyment of life 8 What TIME of day is your pain at its worst? ALL Sleep (in general) Poor  Pain is worse with: walking, sitting, standing and some activites Pain improves with: rest, heat/ice and medication Relief from Meds: 5  Mobility walk without assistance how many minutes can you walk? 10 ability to climb steps?  yes do you drive?  yes  Function disabled: date disabled 08/2004  Neuro/Psych No problems in this area  Prior Studies Any changes since last visit?  no  Physicians involved in your care Any changes since last visit?  no   Family History  Problem Relation Age of Onset  . Hypertension Mother   . Hypertension Father    History   Social History  . Marital Status: Married    Spouse Name: N/A    Number of Children: N/A  . Years of Education: N/A   Social History Main Topics  . Smoking status: Former Smoker -- 28 years    Types: Cigarettes    Quit date: 11/19/2013  . Smokeless tobacco: Never Used     Comment: has been 1 week without smoking (using electronic)  . Alcohol Use: None  . Drug Use: None  . Sexual Activity: None   Other Topics Concern  . None   Social History Narrative   Past Surgical History  Procedure Laterality Date  . Spine surgery    . Coronary artery bypass graft    . Tonsilectomy, adenoidectomy, bilateral myringotomy and tubes     Past Medical History  Diagnosis Date  . Depression   . Hyperlipidemia   . Hypertension    BP 110/70 mmHg  Pulse 68  Resp 14  Ht 5\' 8"  (1.727 m)  Wt 225 lb  (102.059 kg)  BMI 34.22 kg/m2  SpO2 100%  Opioid Risk Score:   Fall Risk Score: Low Fall Risk (0-5 points) Review of Systems  Constitutional: Negative.   HENT: Negative.   Eyes: Negative.   Respiratory: Negative.   Cardiovascular: Negative.   Gastrointestinal: Negative.   Endocrine: Negative.   Genitourinary: Negative.   Musculoskeletal: Positive for myalgias and back pain.  Skin: Negative.   Allergic/Immunologic: Negative.   Neurological: Negative.   Hematological: Negative.   Psychiatric/Behavioral: Negative.        Objective:   Physical Exam  Constitutional: He is oriented to person, place, and time. He appears well-developed and well-nourished.  HENT:  Head: Normocephalic and atraumatic.  Eyes: Conjunctivae are normal. Pupils are equal, round, and reactive to light.  Musculoskeletal:       Right hip: He exhibits decreased range of motion.       Left hip: He exhibits decreased range of motion.  Neurological: He is alert and oriented to person, place, and time. He has normal strength.  Psychiatric: He has a normal mood and affect.  Nursing note and vitals reviewed.         Assessment & Plan:  Lumbago. Patient would like to come every 2 months however we discussed  that this would require a change in medications to a schedule 3 medication. For now he like to continue to hydrocodone as prescribed. 10 mg 4 times a day with monthly visits for medication monitoring  Have given him Tylenol No. 4 , 16 tablets he can take it 4 times a day for several days to evaluate effectiveness If this is helpful we may switch next month from hydrocodone to Tylenol No. 4  2. Status post L5-S1 Depuy total disk replacement. Chronic postoperative pain. Patient has been seen by orthopedics in the past and they have been reluctant to remove the disc secondary to potential, patient's from surgery by anterior approach. Patient also does not want to pursue that 3. Neurogenic claudication, symptoms  have improved in this regard.  4. 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 Recommend continued Exercise and range of motion

## 2014-02-04 NOTE — Patient Instructions (Signed)
Substitute Tylenol No. 4 for the hydrocodone for 4 days

## 2014-03-05 ENCOUNTER — Encounter: Payer: Self-pay | Admitting: Registered Nurse

## 2014-03-22 ENCOUNTER — Telehealth: Payer: Self-pay | Admitting: *Deleted

## 2014-03-22 MED ORDER — ACETAMINOPHEN-CODEINE 300-60 MG PO TABS: 1.0000 | ORAL_TABLET | Freq: Four times a day (QID) | ORAL | Status: DC | PRN

## 2014-03-22 NOTE — Telephone Encounter (Signed)
Notified Mr Derek Sherman by personal identified voicemail that we have called med in with 2 refills and he may reschedule his appt for 3 months with Enunice.  I have cancelled the !/12/16 appt.  Please call and schedule Mr Derek Sherman with Riley Lamunice in 3 months. (he is on tyl # 4 now instead of CII)

## 2014-03-22 NOTE — Telephone Encounter (Signed)
Can we call this in for him-a full rx?

## 2014-03-22 NOTE — Telephone Encounter (Signed)
Pt has been w/o pain meds since Jan 1st, Dr. Wynn BankerKirsteins had switched pt to tylenol #4, pt said they are working good, pt said his appt is not until Tuesday, since he has been out of meds for awhile, he his hurting more than usual and would like to get his script filled asap

## 2014-03-26 ENCOUNTER — Ambulatory Visit: Payer: Self-pay | Admitting: Registered Nurse

## 2014-03-29 IMAGING — CR DG LUMBAR SPINE COMPLETE W/ BEND
7 series · 7 of 7 positions shown · non-contrast
Comparison: None.

CLINICAL DATA: Status post  De Puy disc replacement

LUMBAR SPINE - COMPLETE WITH BENDING VIEWS

[view not recorded (1 of 7)]
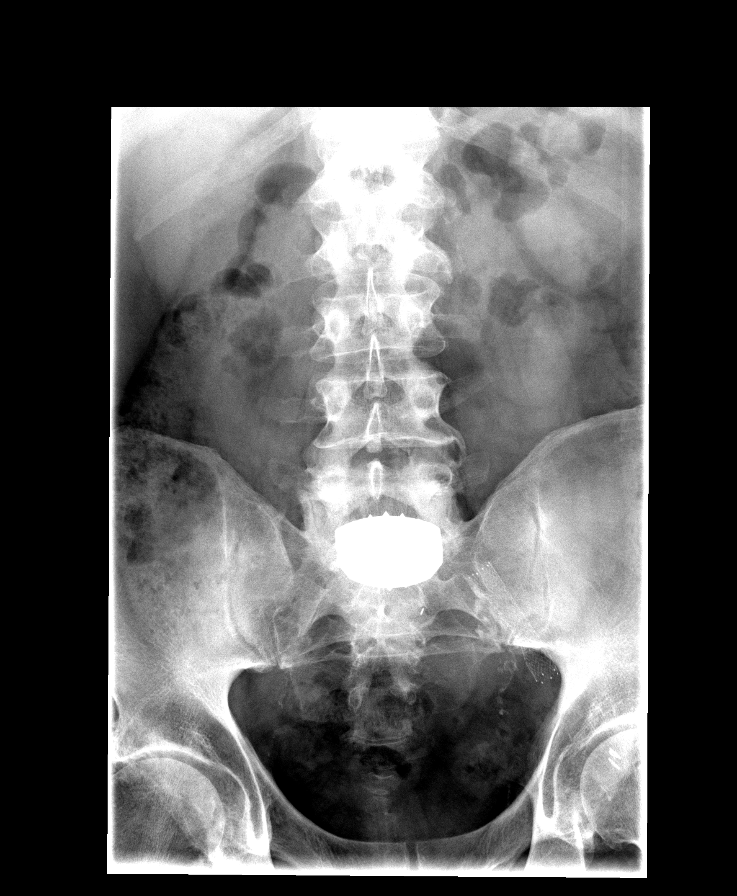

[view not recorded (2 of 7)]
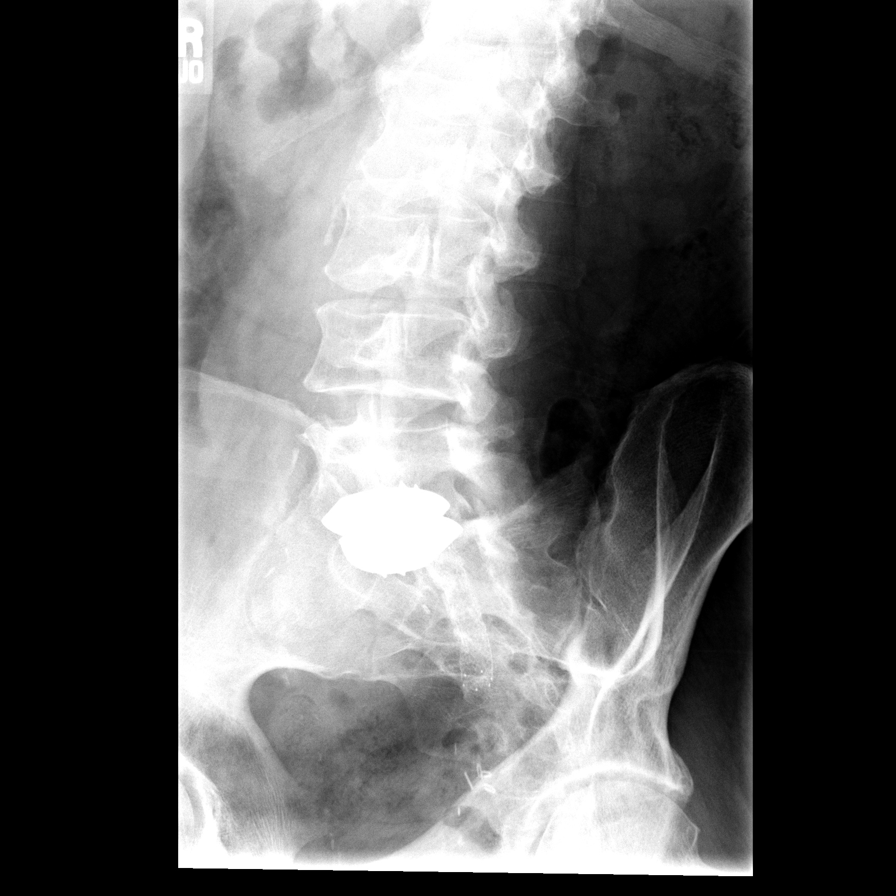

[view not recorded (3 of 7)]
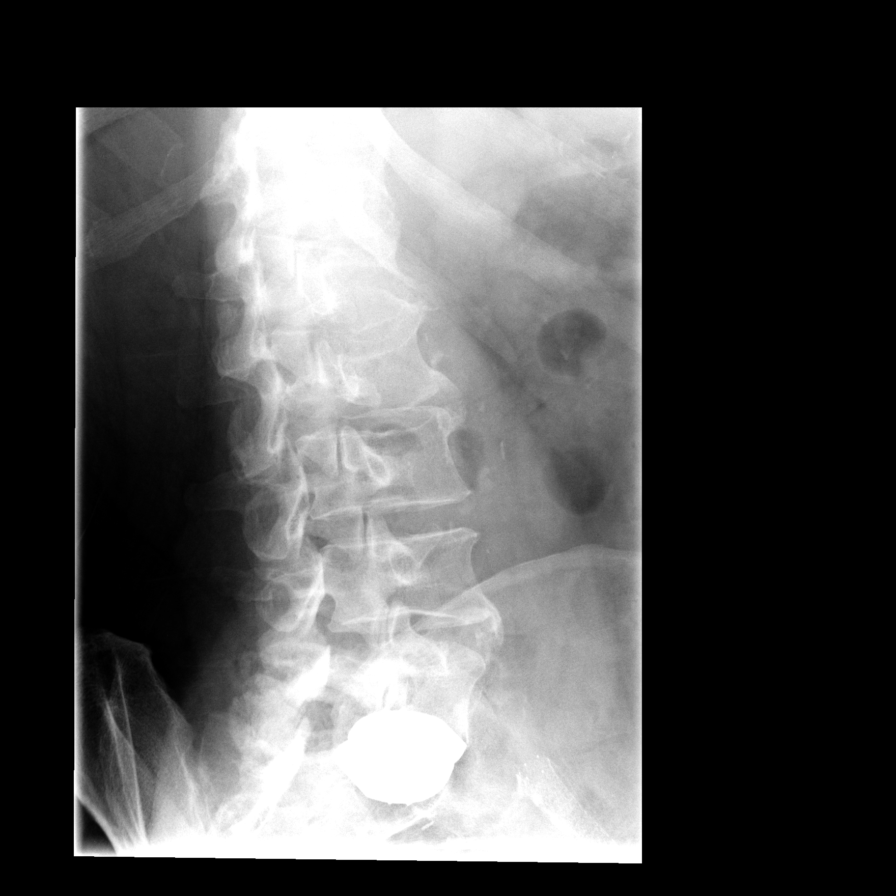

[view not recorded (4 of 7)]
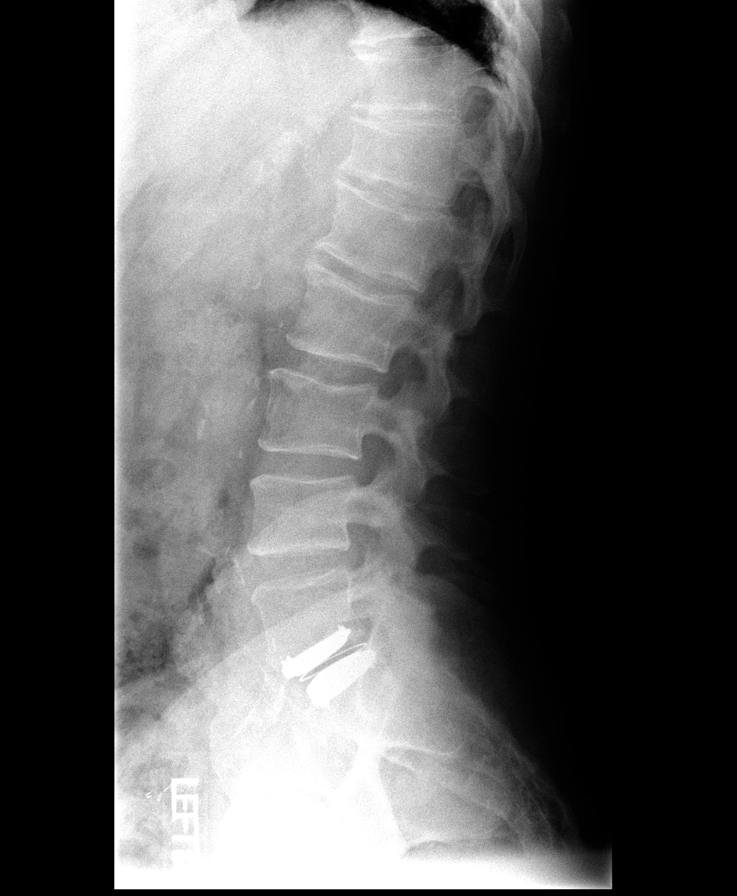

[view not recorded (5 of 7)]
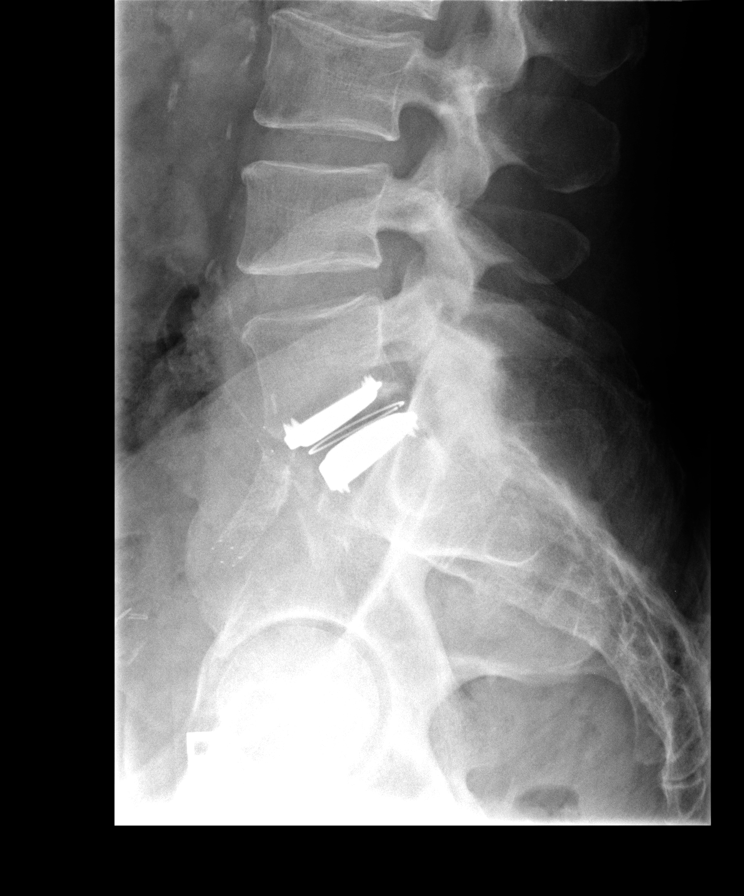

[view not recorded (6 of 7)]
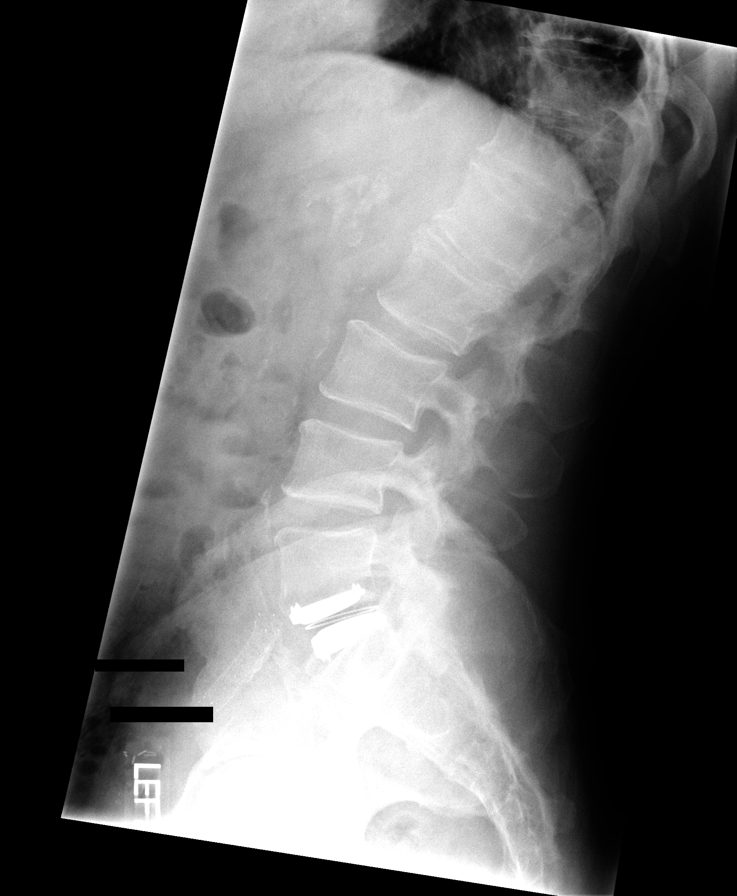

[view not recorded (7 of 7)]
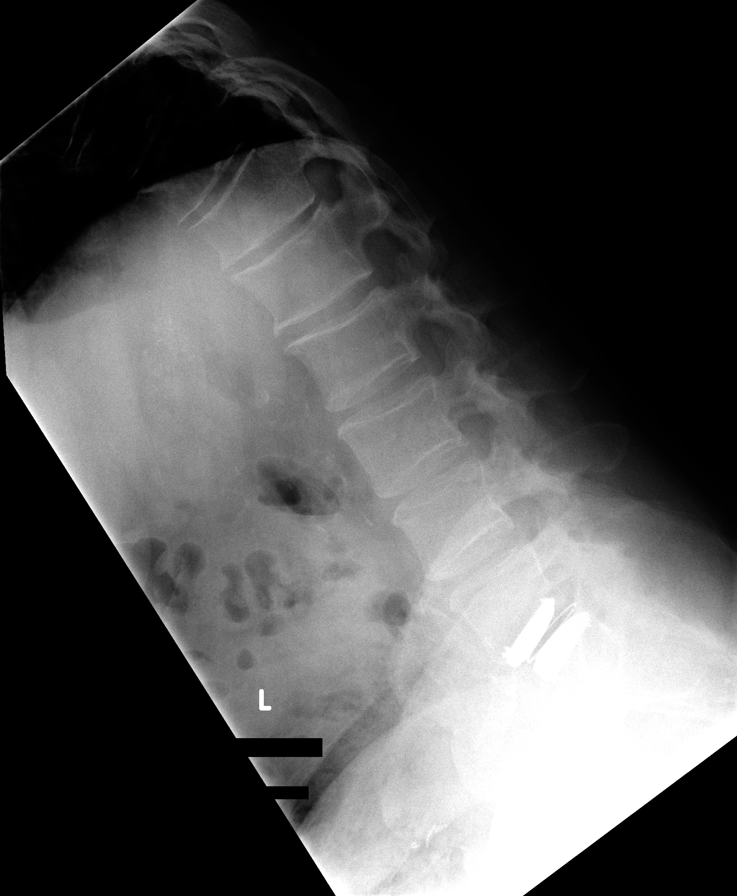

[7 of 7 positions shown; findings below may reference images not displayed]

FINDINGS: Seven views of the lumbar spine submitted.  Postsurgical
changes post artificial disc replacement noted at L5 S1 level. No
acute fracture or subluxation.  Mild disc space flattening with
mild anterior spurring noted at T12-L1 and L1-L2 level.
Flexion/extension views shows no evidence of the lumbar
instability.  Alignment at L5 S1 level is preserved on flexion
extension views.

A vascular stent is noted in the left common iliac artery region.
IMPRESSION: No acute fracture or subluxation.  Postsurgical changes post disc
replacement at L5 S1 level.  No evidence of the lumbar instability
on flexion extension views. Mild degenerative changes T12-L1 and L1
- L2 level.

## 2014-05-06 ENCOUNTER — Telehealth: Payer: Self-pay | Admitting: *Deleted

## 2014-05-06 NOTE — Telephone Encounter (Signed)
Pt called asking for refill on Tylenol #4

## 2014-05-07 NOTE — Telephone Encounter (Signed)
Called the pharmacy they forgot to add the 2 refills as prescribed, the 2 refills were added to the order and rx is being filled. I called the patient and notified his script will be ready soon

## 2014-07-08 ENCOUNTER — Encounter: Payer: Self-pay | Admitting: Registered Nurse

## 2014-07-08 ENCOUNTER — Other Ambulatory Visit: Payer: Self-pay | Admitting: Registered Nurse

## 2014-07-08 ENCOUNTER — Encounter: Payer: Medicare Other | Attending: Registered Nurse | Admitting: Registered Nurse

## 2014-07-08 VITALS — BP 119/74 | HR 69 | Resp 14

## 2014-07-08 DIAGNOSIS — Z79899 Other long term (current) drug therapy: Secondary | ICD-10-CM | POA: Diagnosis not present

## 2014-07-08 DIAGNOSIS — M47817 Spondylosis without myelopathy or radiculopathy, lumbosacral region: Secondary | ICD-10-CM

## 2014-07-08 DIAGNOSIS — M545 Low back pain: Secondary | ICD-10-CM | POA: Insufficient documentation

## 2014-07-08 DIAGNOSIS — M961 Postlaminectomy syndrome, not elsewhere classified: Secondary | ICD-10-CM

## 2014-07-08 DIAGNOSIS — G8929 Other chronic pain: Secondary | ICD-10-CM | POA: Insufficient documentation

## 2014-07-08 DIAGNOSIS — Z5181 Encounter for therapeutic drug level monitoring: Secondary | ICD-10-CM

## 2014-07-08 MED ORDER — ACETAMINOPHEN-CODEINE 300-60 MG PO TABS: 1.0000 | ORAL_TABLET | Freq: Four times a day (QID) | ORAL | Status: DC | PRN

## 2014-07-08 NOTE — Progress Notes (Signed)
Subjective:    Patient ID: Derek Sherman, male    DOB: 01/28/1961, 54 y.o.   MRN: [id number]  HPI : Derek Sherman is a 54 year old male who returns for follow up for chronic pain and medication refill. He says his pain is located in his lower back. He rates his pain 8. His current exercise regime is walking and [facility].  Pain Inventory Average Pain 8 Pain Right Now 8 My pain is constant, stabbing and aching  In the last 24 hours, has pain interfered with the following? General activity 8 Relation with others 8 Enjoyment of life 8 What TIME of day is your pain at its worst? all Sleep (in general) Poor  Pain is worse with: walking, sitting, standing and some activites Pain improves with: rest, therapy/exercise and medication Relief from Meds: 2  Mobility walk without assistance ability to climb steps?  yes do you drive?  yes  Function disabled: date disabled 06/06 Do you have any goals in this area?  yes  Neuro/Psych No problems in this area  Prior Studies Any changes since last visit?  no  Physicians involved in your care Any changes since last visit?  no   Family History  Problem Relation Age of Onset  . Hypertension Mother   . Hypertension Father    History   Social History  . Marital Status: Married    Spouse Name: N/A  . Number of Children: N/A  . Years of Education: N/A   Social History Main Topics  . Smoking status: Former Smoker -- 28 years    Types: Cigarettes    Quit date: 11/19/2013  . Smokeless tobacco: Never Used     Comment: has been 1 week without smoking (using electronic)  . Alcohol Use: Not on file  . Drug Use: Not on file  . Sexual Activity: Not on file   Other Topics Concern  . None   Social History Narrative   Past Surgical History  Procedure Laterality Date  . Spine surgery    . Coronary artery bypass graft    . Tonsilectomy, adenoidectomy, bilateral  myringotomy and tubes     Past Medical History  Diagnosis Date  . Depression   . Hyperlipidemia   . Hypertension    BP 119/74 mmHg  Pulse 69  Resp 14  SpO2 97%  Opioid Risk Score:   Fall Risk Score: Low Fall Risk (0-5 points) (previously educated and given handout)`1  Depression screen PHQ 2/9  Depression screen PHQ 2/9 07/08/2014  Decreased Interest 0  Down, Depressed, Hopeless 0  PHQ - 2 Score 0  Altered sleeping 2  Tired, decreased energy 0  Change in appetite 0  Feeling bad or failure about yourself  0  Trouble concentrating 0  Moving slowly or fidgety/restless 0  Suicidal thoughts 0  PHQ-9 Score 2    Review of Systems  All other systems reviewed and are negative.      Objective:   Physical Exam  Constitutional: He is oriented to person, place, and time. He appears well-developed and well-nourished.  HENT:  Head: Normocephalic and atraumatic.  Neck: Normal range of motion. Neck supple.  Cardiovascular: Normal rate and regular rhythm.   Pulmonary/Chest: Effort normal and breath sounds normal.  Musculoskeletal:  Normal Muscle Bulk and Muscle Testing Reveals: Upper Extremities: Full ROM and Muscle Strength 5/5 Back without spinal or paraspinal tenderness Lower Extremities: Full  ROM and Muscle Strength 5/5 Arises from chair with ease Narrow Based Gait  Neurological: He is alert and oriented to person, place, and time.  Skin: Skin is warm and dry.  Psychiatric: He has a normal mood and affect.  Nursing note and vitals reviewed.         Assessment & Plan:  1.Chronic Low back Pain: Refilled: Tylenol #4 300-60 mg  one tablet every 6 hours # 120. Continue Aquatic Therapy twice a week. Continue with exercise and heat therapy.  15 minutes of face to face patient care time was spent during this visit. All questions were encouraged and answered.   F/U in 3 month

## 2014-07-09 LAB — PMP ALCOHOL METABOLITE (ETG): Ethyl Glucuronide (EtG): NEGATIVE ng/mL

## 2014-07-12 LAB — OPIATES/OPIOIDS (LC/MS-MS)
Codeine Urine: 14512 ng/mL (ref <50)
Hydrocodone: 105 ng/mL — AB (ref <50)
Hydromorphone: 61 ng/mL — AB (ref <50)
Morphine Urine: 4655 ng/mL (ref <50)
Norhydrocodone, Ur: 117 ng/mL — AB (ref <50)
Noroxycodone, Ur: NEGATIVE ng/mL (ref <50)
OXYCODONE, UR: NEGATIVE ng/mL (ref <50)
OXYMORPHONE, URINE: NEGATIVE ng/mL (ref <50)

## 2014-07-13 LAB — PRESCRIPTION MONITORING PROFILE (SOLSTAS)
Amphetamine/Meth: NEGATIVE ng/mL
BUPRENORPHINE, URINE: NEGATIVE ng/mL
Barbiturate Screen, Urine: NEGATIVE ng/mL
Benzodiazepine Screen, Urine: NEGATIVE ng/mL
CANNABINOID SCRN UR: NEGATIVE ng/mL
CARISOPRODOL, URINE: NEGATIVE ng/mL
COCAINE METABOLITES: NEGATIVE ng/mL
Creatinine, Urine: 57.72 mg/dL (ref >20.0)
Fentanyl, Ur: NEGATIVE ng/mL
MDMA URINE: NEGATIVE ng/mL
Meperidine, Ur: NEGATIVE ng/mL
Methadone Screen, Urine: NEGATIVE ng/mL
Nitrites, Initial: NEGATIVE ug/mL
Oxycodone Screen, Ur: NEGATIVE ng/mL
Propoxyphene: NEGATIVE ng/mL
TRAMADOL UR: NEGATIVE ng/mL
Tapentadol, urine: NEGATIVE ng/mL
Zolpidem, Urine: NEGATIVE ng/mL
pH, Initial: 5.3 pH (ref 4.5–8.9)

## 2014-07-18 NOTE — Progress Notes (Signed)
Urine drug screen for this encounter is consistent for prescribed medication 

## 2014-10-02 ENCOUNTER — Telehealth: Payer: Self-pay | Admitting: *Deleted

## 2014-10-02 MED ORDER — ACETAMINOPHEN-CODEINE 300-60 MG PO TABS: 1.0000 | ORAL_TABLET | Freq: Four times a day (QID) | ORAL | Status: DC | PRN

## 2014-10-02 NOTE — Telephone Encounter (Signed)
[name] is scheduled to see Dr. Wynn BankerKirsteins 10/07/2014 presumably to receive his next script for a 3 month supply of Tylenol #4's.  He is asking for a one time exception, citing economic hardship, in which he is asking if he can cancel this months visit, reschedule for August and receive a script for 1 month to bridge him until a scheduled August visit.  He was apologetic, and stated he simply cannot afford to pay for both the doctors visit and the medication this month.

## 2014-10-02 NOTE — Telephone Encounter (Signed)
May have one-month supply of Tylenol 4 #120 one by mouth 4 times a day 1RF  NP f/u in 2 months

## 2014-10-02 NOTE — Telephone Encounter (Signed)
I called in the rx, attempted to call the pt to no avail. cellphone goes to busy immediately and home phone does not have voicemail.  I left a detailed message to the pharmacy that the pt will need to make an appt  For mid September in order to receive any future scripts

## 2014-10-07 ENCOUNTER — Ambulatory Visit: Payer: Self-pay | Admitting: Physical Medicine & Rehabilitation

## 2015-01-07 ENCOUNTER — Encounter: Payer: Medicare Other | Attending: Physical Medicine & Rehabilitation

## 2015-01-07 ENCOUNTER — Ambulatory Visit: Payer: Medicare Other | Admitting: Physical Medicine & Rehabilitation

## 2015-01-07 DIAGNOSIS — Z7902 Long term (current) use of antithrombotics/antiplatelets: Secondary | ICD-10-CM | POA: Insufficient documentation

## 2015-01-07 DIAGNOSIS — E785 Hyperlipidemia, unspecified: Secondary | ICD-10-CM | POA: Insufficient documentation

## 2015-01-07 DIAGNOSIS — M961 Postlaminectomy syndrome, not elsewhere classified: Secondary | ICD-10-CM | POA: Insufficient documentation

## 2015-01-07 DIAGNOSIS — F329 Major depressive disorder, single episode, unspecified: Secondary | ICD-10-CM | POA: Insufficient documentation

## 2015-01-07 DIAGNOSIS — M545 Low back pain: Secondary | ICD-10-CM | POA: Insufficient documentation

## 2015-01-07 DIAGNOSIS — Z79899 Other long term (current) drug therapy: Secondary | ICD-10-CM | POA: Insufficient documentation

## 2015-01-07 DIAGNOSIS — Z87891 Personal history of nicotine dependence: Secondary | ICD-10-CM | POA: Insufficient documentation

## 2015-01-07 DIAGNOSIS — Z5181 Encounter for therapeutic drug level monitoring: Secondary | ICD-10-CM | POA: Insufficient documentation

## 2015-01-07 DIAGNOSIS — G8929 Other chronic pain: Secondary | ICD-10-CM | POA: Insufficient documentation

## 2015-01-07 DIAGNOSIS — I10 Essential (primary) hypertension: Secondary | ICD-10-CM | POA: Insufficient documentation

## 2015-01-10 ENCOUNTER — Other Ambulatory Visit: Payer: Self-pay | Admitting: Physical Medicine & Rehabilitation

## 2015-01-10 ENCOUNTER — Encounter: Payer: Self-pay | Admitting: Physical Medicine & Rehabilitation

## 2015-01-10 ENCOUNTER — Ambulatory Visit (HOSPITAL_BASED_OUTPATIENT_CLINIC_OR_DEPARTMENT_OTHER): Payer: Medicare Other | Admitting: Physical Medicine & Rehabilitation

## 2015-01-10 VITALS — BP 152/95 | HR 74 | Resp 16

## 2015-01-10 DIAGNOSIS — G8929 Other chronic pain: Secondary | ICD-10-CM

## 2015-01-10 DIAGNOSIS — Z5181 Encounter for therapeutic drug level monitoring: Secondary | ICD-10-CM

## 2015-01-10 DIAGNOSIS — Z87891 Personal history of nicotine dependence: Secondary | ICD-10-CM | POA: Diagnosis not present

## 2015-01-10 DIAGNOSIS — M961 Postlaminectomy syndrome, not elsewhere classified: Secondary | ICD-10-CM | POA: Diagnosis not present

## 2015-01-10 DIAGNOSIS — E785 Hyperlipidemia, unspecified: Secondary | ICD-10-CM | POA: Diagnosis not present

## 2015-01-10 DIAGNOSIS — M545 Low back pain, unspecified: Secondary | ICD-10-CM

## 2015-01-10 DIAGNOSIS — F329 Major depressive disorder, single episode, unspecified: Secondary | ICD-10-CM | POA: Diagnosis not present

## 2015-01-10 DIAGNOSIS — Z7902 Long term (current) use of antithrombotics/antiplatelets: Secondary | ICD-10-CM | POA: Diagnosis not present

## 2015-01-10 DIAGNOSIS — I10 Essential (primary) hypertension: Secondary | ICD-10-CM | POA: Diagnosis not present

## 2015-01-10 DIAGNOSIS — Z79899 Other long term (current) drug therapy: Secondary | ICD-10-CM | POA: Diagnosis not present

## 2015-01-10 MED ORDER — ACETAMINOPHEN-CODEINE #4 300-60 MG PO TABS: 1.0000 | ORAL_TABLET | Freq: Four times a day (QID) | ORAL | Status: DC | PRN

## 2015-01-10 NOTE — Progress Notes (Signed)
Subjective:    Patient ID: Derek Sherman, male    DOB: February 02, 1961, 54 y.o.   MRN: [id number]  HPI 54 year old male with history of L5-S1 disc replacement in 2006. He is had chronic low back pain since prior to the surgery. Patient has been evaluated by orthopedic surgery after the replacement and no further surgical intervention was recommended due to risk of complications. Patient has been independent with all his self-care and mobility does not use any type of assisted device., Patient is on disability and is on Medicare now. Patient has not been seen in the clinic since April of this year due to financial reasons but now he knows he can file his visits under Medicare.  Has tried lumbar injections in the past but they were not. Helpful. He is on Plavix for multiple cardiac and peripheral artery stents and would be at risk for thrombosis if Plavix was discontinued  Last Tylenol No. 4 tablet was in September. His pain scores while on the Tylenol No. 4 8/10 currently they're 9/10. We discussed that this is in line with chronic use of opioids which can reduce pain by 10-20% on average  Patient doing some gardening work put in some flowers the other day.  Pain Inventory Average Pain 9 Pain Right Now 9 My pain is constant, sharp and stabbing  In the last 24 hours, has pain interfered with the following? General activity 9 Relation with others 9 Enjoyment of life 9 What TIME of day is your pain at its worst? Morning, Daytime, Evening and Night Sleep (in general) NA  Pain is worse with: walking, sitting and standing Pain improves with: rest and medication Relief from Meds: 0  Mobility do you drive?  yes  Function disabled: date disabled 08/2006  Neuro/Psych No problems in this area  Prior Studies Any changes since last visit?  no  Physicians involved in your care Any changes since last visit?  no   Family History  Problem Relation Age of Onset  . Hypertension Mother   .  Hypertension Father    Social History   Social History  . Marital Status: Married    Spouse Name: N/A  . Number of Children: N/A  . Years of Education: N/A   Social History Main Topics  . Smoking status: Former Smoker -- 28 years    Types: Cigarettes    Quit date: 11/19/2013  . Smokeless tobacco: Never Used     Comment: has been 1 week without smoking (using electronic)  . Alcohol Use: None  . Drug Use: None  . Sexual Activity: Not Asked   Other Topics Concern  . None   Social History Narrative   Past Surgical History  Procedure Laterality Date  . Spine surgery    . Coronary artery bypass graft    . Tonsilectomy, adenoidectomy, bilateral myringotomy and tubes     Past Medical History  Diagnosis Date  . Depression   . Hyperlipidemia   . Hypertension    BP 152/95 mmHg  Pulse 74  Resp 16  SpO2 98%  Opioid Risk Score:   Fall Risk Score:  `1  Depression screen PHQ 2/9  Depression screen PHQ 2/9 07/08/2014  Decreased Interest 0  Down, Depressed, Hopeless 0  PHQ - 2 Score 0  Altered sleeping 2  Tired, decreased energy 0  Change in appetite 0  Feeling bad or failure about yourself  0  Trouble concentrating 0  Moving slowly or fidgety/restless 0  Suicidal thoughts 0  PHQ-9 Score 2     Review of Systems  All other systems reviewed and are negative.      Objective:   Physical Exam  Constitutional: He is oriented to person, place, and time. He appears well-developed and well-nourished.  HENT:  Head: Normocephalic and atraumatic.  Eyes: Conjunctivae and EOM are normal. Pupils are equal, round, and reactive to light.  Neck: Normal range of motion. Neck supple.  Neurological: He is alert and oriented to person, place, and time. He has normal reflexes.  Psychiatric: He has a normal mood and affect.  Nursing note and vitals reviewed. Motor strength is 5/5 bilateral hip flexor and extensor ankle dorsiflexor Lumbar range of motion is reduced has 50% flexion,  0-25% extension of the lumbar spine. Straight leg raising is negative        Assessment & Plan:  1. 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 Will restart Tylenol No. 4 one tablet 4 times per day. We will recheck urine screen today. No interventional spine procedures recommended No need for surgical reevaluation at the current time.  Follow-up in 3 months with nurse practitioner.

## 2015-01-14 LAB — PRESCRIPTION MONITORING PROFILE (SOLSTAS)
Amphetamine/Meth: NEGATIVE ng/mL
BARBITURATE SCREEN, URINE: NEGATIVE ng/mL
Benzodiazepine Screen, Urine: NEGATIVE ng/mL
Buprenorphine, Urine: NEGATIVE ng/mL
CANNABINOID SCRN UR: NEGATIVE ng/mL
COCAINE METABOLITES: NEGATIVE ng/mL
Carisoprodol, Urine: NEGATIVE ng/mL
Creatinine, Urine: 58.62 mg/dL (ref >20.0)
Fentanyl, Ur: NEGATIVE ng/mL
MDMA URINE: NEGATIVE ng/mL
Meperidine, Ur: NEGATIVE ng/mL
Methadone Screen, Urine: NEGATIVE ng/mL
Nitrites, Initial: NEGATIVE ug/mL
Opiate Screen, Urine: NEGATIVE ng/mL
Oxycodone Screen, Ur: NEGATIVE ng/mL
PH URINE, INITIAL: 4.8 pH (ref 4.5–8.9)
PROPOXYPHENE: NEGATIVE ng/mL
Tapentadol, urine: NEGATIVE ng/mL
Tramadol Scrn, Ur: NEGATIVE ng/mL
ZOLPIDEM, URINE: NEGATIVE ng/mL

## 2015-01-14 LAB — PMP ALCOHOL METABOLITE (ETG): Ethyl Glucuronide (EtG): NEGATIVE ng/mL

## 2015-01-27 NOTE — Progress Notes (Signed)
Urine drug screen for this encounter is consistent for prescribed medication. Patient reported no medications within the last month. Patient had no metabolites of medication in urine.

## 2015-04-11 ENCOUNTER — Encounter: Payer: Medicare Other | Attending: Registered Nurse | Admitting: Registered Nurse

## 2015-05-15 ENCOUNTER — Telehealth: Payer: Self-pay | Admitting: *Deleted

## 2015-05-15 NOTE — Telephone Encounter (Signed)
Patient left a message that he has an appointment with Riley Lam on 05/20/2015, but he is going to run out of meds tomorrow.  He is asking [place] to call in his script so he does not run out and then he will be in to see Riley Lam on the 7th. I made multiple, multiple attempts to contact patient at every phone number he provided.  Unable to contact patient.

## 2015-05-16 ENCOUNTER — Telehealth: Payer: Self-pay | Admitting: Physical Medicine & Rehabilitation

## 2015-05-16 MED ORDER — ACETAMINOPHEN-CODEINE #4 300-60 MG PO TABS: 1.0000 | ORAL_TABLET | Freq: Four times a day (QID) | ORAL | Status: DC | PRN

## 2015-05-16 NOTE — Telephone Encounter (Signed)
Spoke with patient about him being out of medication and does not have an appointment until Tuesday with Riley LamEunice.  I explained to him that he had not been in our office since Oct 2016 and had an appointment in January that he didn't show up to.  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  I told him that we did not know that he had either of these surgeries and for further, to please let us know, either by yourself or wife calling into our office.  He is going to need his medication because he runs out tomorrow.  Gillis EndsSybil will ask Dr. Wynn BankerKirsteins if we can bridge a prescription until he is seen by Riley LamEunice on Tuesday or may have to wait until Tuesday to get his prescription.  We have also been having trouble reaching patient by phone.  When trying to reach him by office his phone rings busy, but I did try calling him from my cell phone and explained to him to call the office and also to check with his phone carrier to get our [phone] number unblocked, so that he can get calls from our office.  Instructed patient to call us back in 30 min.

## 2015-05-16 NOTE — Telephone Encounter (Signed)
Per Dr Wynn BankerKirsteins we can refill Tylenol#4 for one month.Called to pharmacy

## 2015-05-20 ENCOUNTER — Encounter: Payer: Self-pay | Admitting: Physical Medicine & Rehabilitation

## 2015-05-20 ENCOUNTER — Encounter: Payer: Medicare Other | Admitting: Registered Nurse

## 2015-05-20 ENCOUNTER — Ambulatory Visit (HOSPITAL_BASED_OUTPATIENT_CLINIC_OR_DEPARTMENT_OTHER): Payer: Medicare Other | Admitting: Physical Medicine & Rehabilitation

## 2015-05-20 ENCOUNTER — Encounter: Payer: Medicare Other | Attending: Registered Nurse

## 2015-05-20 VITALS — BP 133/64 | HR 61 | Resp 14

## 2015-05-20 DIAGNOSIS — I739 Peripheral vascular disease, unspecified: Secondary | ICD-10-CM | POA: Insufficient documentation

## 2015-05-20 DIAGNOSIS — I252 Old myocardial infarction: Secondary | ICD-10-CM | POA: Insufficient documentation

## 2015-05-20 DIAGNOSIS — Z79899 Other long term (current) drug therapy: Secondary | ICD-10-CM | POA: Diagnosis not present

## 2015-05-20 DIAGNOSIS — G894 Chronic pain syndrome: Secondary | ICD-10-CM | POA: Diagnosis not present

## 2015-05-20 DIAGNOSIS — Z5181 Encounter for therapeutic drug level monitoring: Secondary | ICD-10-CM | POA: Insufficient documentation

## 2015-05-20 DIAGNOSIS — M47817 Spondylosis without myelopathy or radiculopathy, lumbosacral region: Secondary | ICD-10-CM

## 2015-05-20 DIAGNOSIS — Z89511 Acquired absence of right leg below knee: Secondary | ICD-10-CM | POA: Insufficient documentation

## 2015-05-20 MED ORDER — METHOCARBAMOL 500 MG PO TABS: 500.0000 mg | ORAL_TABLET | Freq: Two times a day (BID) | ORAL | Status: AC

## 2015-05-20 MED ORDER — ACETAMINOPHEN-CODEINE #4 300-60 MG PO TABS: 1.0000 | ORAL_TABLET | Freq: Four times a day (QID) | ORAL | Status: DC | PRN

## 2015-05-20 NOTE — Progress Notes (Signed)
Subjective:    Patient ID: Derek Sherman, male    DOB: 07/21/1960, 55 y.o.   MRN: [id number] right common femoral endarterectomy and vein patch angioplasty with right external iliac stent placement on 02/03/2015. Patient had just suffered a heart attack. He presented to our hospital with an occluded right external iliac artery and underwent thrombolysis by Dr. Henderson CloudHorvath. He was successful in opening up the inflow but the patient had an obvious dissection of his right common femoral artery. I repaired this in an open fashion, but unfortunately we were not able to save his right foot. He underwent right below-knee amputation on 02/10/2015  HPI Patient received his below-knee prosthesis from [employer]. He is currently getting trained with this. He does not have it on today. Patient is independent with dressing and bathing. Patient still mainly using the wheelchair and walker to get around the house. [facility] .      Quit smoking.  Had MI around perioperative time   No sig phantom pain.    Pain Inventory Average Pain 8 Pain Right Now 8 My pain is NA  In the last 24 hours, has pain interfered with the following? General activity 8 Relation with others 8 Enjoyment of life 8 What TIME of day is your pain at its worst? morning, daytime, evening, night Sleep (in general) NA  Pain is worse with: walking, sitting and standing Pain improves with: rest and medication Relief from Meds: NA  Mobility walk with assistance ability to climb steps?  no do you drive?  no Do you have any goals in this area?  yes  Function employed # of hrs/week 40 disabled: date disabled 08/2004 Do you have any goals in this area?  no  Neuro/Psych No problems in this area  Prior Studies Any changes since last visit?  yes bone scan x-rays  Physicians involved in your care Any changes since last visit?  yes   Family History  Problem Relation Age of Onset  .  Hypertension Mother   . Hypertension Father    Social History   Social History  . Marital Status: Married    Spouse Name: N/A  . Number of Children: N/A  . Years of Education: N/A   Social History Main Topics  . Smoking status: Former Smoker -- 28 years    Types: Cigarettes    Quit date: 11/19/2013  . Smokeless tobacco: Never Used     Comment: has been 1 week without smoking (using electronic)  . Alcohol Use: None  . Drug Use: None  . Sexual Activity: Not Asked   Other Topics Concern  . None   Social History Narrative   Past Surgical History  Procedure Laterality Date  . Spine surgery    . Coronary artery bypass graft    . Tonsilectomy, adenoidectomy, bilateral myringotomy and tubes     Past Medical History  Diagnosis Date  . Depression   . Hyperlipidemia   . Hypertension    BP 133/64 mmHg  Pulse 61  Resp 14  SpO2 97%  Opioid Risk Score:   Fall Risk Score:  `1  Depression screen PHQ 2/9  [facility] 05/20/2015 07/08/2014  Decreased Interest 3 0  Down, Depressed, Hopeless 1 0  PHQ - 2 Score 4 0  Altered sleeping 3 2  Tired, decreased energy 1 0  Change in appetite 0 0  Feeling bad or failure about yourself  0 0  Trouble concentrating 0 0  Moving slowly or fidgety/restless 0 0  Suicidal thoughts 0 0  PHQ-9 Score 8 2  Difficult doing work/chores Somewhat difficult -     Review of Systems  All other systems reviewed and are negative.      Objective:   Physical Exam  Constitutional: He is oriented to person, place, and time. He appears well-developed and well-nourished.  HENT:  Head: Normocephalic and atraumatic.  Eyes: Conjunctivae and EOM are normal. Pupils are equal, round, and reactive to light.  Neck: Normal range of motion.  Neurological: He is alert and oriented to person, place, and time.  Psychiatric: He has a normal mood and affect.  Nursing note and vitals reviewed.  Gen. No acute distress Well healed incision, long  stump, mild edema No pain to palpation in the thoracic or lumbar paraspinal muscles. Motor strength is 3 minus left grip 4 minus biceps triceps deltoid on the left, 5/5 right deltoid, biceps, triceps, grip 5/5 left hip flexor and extensor ankle dorsi  Right hip flexor 5/5      Assessment & Plan:  1. 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 Will restart Tylenol No. 4 one tablet 4 times per day. We will recheck urine screen today.  2. Peripheral vascular disease with right below-knee amputation  3 months ago, Fitted with prosthesis and is starting to get prosthetic training.  Return to clinic in ~3 months

## 2015-05-26 LAB — TOXASSURE SELECT,+ANTIDEPR,UR: PDF: 0

## 2015-05-26 LAB — 6-ACETYLMORPHINE,TOXASSURE ADD
6-ACETYLMORPHINE: NEGATIVE
6-acetylmorphine: NOT DETECTED ng/mg creat

## 2015-05-27 NOTE — Progress Notes (Signed)
Urine drug screen for this encounter is consistent for prescribed medication 

## 2015-09-22 ENCOUNTER — Ambulatory Visit: Payer: Medicare Other | Admitting: Physical Medicine & Rehabilitation

## 2015-09-30 ENCOUNTER — Ambulatory Visit: Payer: Medicare Other | Admitting: Physical Medicine & Rehabilitation

## 2015-10-03 ENCOUNTER — Encounter: Payer: Self-pay | Admitting: Physical Medicine & Rehabilitation

## 2015-10-03 ENCOUNTER — Encounter: Payer: Medicare Other | Attending: Physical Medicine & Rehabilitation

## 2015-10-03 ENCOUNTER — Ambulatory Visit (HOSPITAL_BASED_OUTPATIENT_CLINIC_OR_DEPARTMENT_OTHER): Payer: Medicare Other | Admitting: Physical Medicine & Rehabilitation

## 2015-10-03 VITALS — BP 118/63 | HR 80 | Resp 17

## 2015-10-03 DIAGNOSIS — M961 Postlaminectomy syndrome, not elsewhere classified: Secondary | ICD-10-CM | POA: Insufficient documentation

## 2015-10-03 DIAGNOSIS — F329 Major depressive disorder, single episode, unspecified: Secondary | ICD-10-CM | POA: Diagnosis not present

## 2015-10-03 DIAGNOSIS — Z96698 Presence of other orthopedic joint implants: Secondary | ICD-10-CM | POA: Insufficient documentation

## 2015-10-03 DIAGNOSIS — I10 Essential (primary) hypertension: Secondary | ICD-10-CM | POA: Insufficient documentation

## 2015-10-03 DIAGNOSIS — M545 Low back pain: Secondary | ICD-10-CM | POA: Insufficient documentation

## 2015-10-03 DIAGNOSIS — Z87891 Personal history of nicotine dependence: Secondary | ICD-10-CM | POA: Insufficient documentation

## 2015-10-03 DIAGNOSIS — I252 Old myocardial infarction: Secondary | ICD-10-CM | POA: Diagnosis not present

## 2015-10-03 DIAGNOSIS — G894 Chronic pain syndrome: Secondary | ICD-10-CM

## 2015-10-03 DIAGNOSIS — Z79899 Other long term (current) drug therapy: Secondary | ICD-10-CM | POA: Diagnosis not present

## 2015-10-03 DIAGNOSIS — Z5181 Encounter for therapeutic drug level monitoring: Secondary | ICD-10-CM

## 2015-10-03 DIAGNOSIS — M47817 Spondylosis without myelopathy or radiculopathy, lumbosacral region: Secondary | ICD-10-CM

## 2015-10-03 DIAGNOSIS — Z89511 Acquired absence of right leg below knee: Secondary | ICD-10-CM

## 2015-10-03 DIAGNOSIS — E785 Hyperlipidemia, unspecified: Secondary | ICD-10-CM | POA: Diagnosis not present

## 2015-10-03 MED ORDER — ACETAMINOPHEN-CODEINE #4 300-60 MG PO TABS: 1.0000 | ORAL_TABLET | Freq: Four times a day (QID) | ORAL | Status: DC | PRN

## 2015-10-03 NOTE — Patient Instructions (Signed)
Please let us know if you establish care with a pain management physician [place].

## 2015-10-03 NOTE — Progress Notes (Signed)
Subjective:    Patient ID: Derek Sherman, male    DOB: [birth year], 55 y.o.   MRN: [id number]  HPI 55 year old male with chronic low back pain. He has been maintained on Tylenol No. 4 one tablet 4 times per day. In the interval time he's had right femoral endarterectomy. He also has had ST elevation myocardial infarction. He follows up at [facility] for these issues. He is wondering if there is any pain management closer to his home in [place].  Patient underwent right below-knee amputation in December 2016. He is in a temporary prosthetic. Plans to get the permanent prosthesis soon. He has had some phantom pain as well as phantom sensation. He feels like his right foot is still there. Pain Inventory Average Pain 9 Pain Right Now 9 My pain is NA  In the last 24 hours, has pain interfered with the following? General activity 9 Relation with others 8 Enjoyment of life 9 What TIME of day is your pain at its worst? morning, daytime, evening, night Sleep (in general) Poor  Pain is worse with: walking, sitting and standing Pain improves with: NA Relief from Meds: NA  Mobility Do you have any goals in this area?  no  Function Do you have any goals in this area?  no  Neuro/Psych No problems in this area  Prior Studies Any changes since last visit?  no LUMBAR SPINE - COMPLETE WITH BENDING VIEWS  Comparison: None.  Findings: Seven views of the lumbar spine submitted. Postsurgical changes post artificial disc replacement noted at L5 S1 level. No acute fracture or subluxation. Mild disc space flattening with mild anterior spurring noted at T12-L1 and L1-L2 level. Flexion/extension views shows no evidence of the lumbar instability. Alignment at L5 S1 level is preserved on flexion extension views.  A vascular stent is noted in the left common iliac artery region.  IMPRESSION: No acute fracture or subluxation. Postsurgical changes post disc replacement at L5 S1  level. No evidence of the lumbar instability on flexion extension views. Mild degenerative changes T12-L1 and L1 - L2 level.   Original Report Authenticated By: Natasha Mead, M.D. Physicians involved in your care Any changes since last visit?  no   Family History  Problem Relation Age of Onset  . Hypertension Mother   . Hypertension Father    Social History   Social History  . Marital Status: Married    Spouse Name: N/A  . Number of Children: N/A  . Years of Education: N/A   Social History Main Topics  . Smoking status: Former Smoker -- 28 years    Types: Cigarettes    Quit date: 11/19/2013  . Smokeless tobacco: Never Used     Comment: has been 1 week without smoking (using electronic)  . Alcohol Use: None  . Drug Use: None  . Sexual Activity: Not Asked   Other Topics Concern  . None   Social History Narrative   Past Surgical History  Procedure Laterality Date  . Spine surgery    . Coronary artery bypass graft    . Tonsilectomy, adenoidectomy, bilateral myringotomy and tubes     Past Medical History  Diagnosis Date  . Depression   . Hyperlipidemia   . Hypertension    BP 118/63 mmHg  Pulse 80  Resp 17  SpO2 95%  Opioid Risk Score:   Fall Risk Score:  `1  Depression screen PHQ 2/9  Depression screen [facility] 2/9 05/20/2015 07/08/2014  Decreased Interest 3 0  Down,  Depressed, Hopeless 1 0  PHQ - 2 Score 4 0  Altered sleeping 3 2  Tired, decreased energy 1 0  Change in appetite 0 0  Feeling bad or failure about yourself  0 0  Trouble concentrating 0 0  Moving slowly or fidgety/restless 0 0  Suicidal thoughts 0 0  PHQ-9 Score 8 2  Difficult doing work/chores Somewhat difficult -     Review of Systems  All other systems reviewed and are negative.      Objective:   Physical Exam  Constitutional: He is oriented to person, place, and time. He appears well-developed and well-nourished.  HENT:  Head: Normocephalic and atraumatic.  Eyes: Conjunctivae  and EOM are normal. Pupils are equal, round, and reactive to light.  Musculoskeletal:       Lumbar back: He exhibits decreased range of motion. He exhibits no deformity.       Right lower leg: He exhibits deformity.  Status post right below knee amputation  Patient with pain during attempted lumbar extension greater than with lumbar flexion. Limited range of motion with flexion, extension and lateral bending.  Neurological: He is alert and oriented to person, place, and time. Gait abnormal.  Ambulates without assisted device. Does have pistoning of the right BKA prosthesis during ambulation  Psychiatric: He has a normal mood and affect.  Vitals reviewed.         Assessment & Plan:  1. Lumbar post laminectomy syndrome status post L5-S1 disc replacement. Has chronic low back pain since that time. He's been maintained on narcotic analgesics and is currently on Tylenol No. 4 one tablet 4 times per day. He has had no signs of medication misuse. He would like [place] get [place]. He will discuss this with his primary care physician next week  2. Peripheral vascular disease. Has had right femoral endarterectomy. Has undergone right BKA in December 2016. He has some phantom limb sensation as well as some pain.  We'll also continue gabapentin 400 mg 3 times per day. This is prescribed by primary care physician.

## 2015-10-03 NOTE — Progress Notes (Deleted)
   Subjective:    Patient ID: Derek Sherman, male    DOB: 06/25/1960, 55 y.o.   MRN: [id number]  HPI    Review of Systems     Objective:   Physical Exam        Assessment & Plan:

## 2015-12-31 ENCOUNTER — Encounter: Payer: Medicare Other | Attending: Registered Nurse | Admitting: Registered Nurse

## 2015-12-31 ENCOUNTER — Encounter: Payer: Self-pay | Admitting: Registered Nurse

## 2015-12-31 VITALS — BP 162/94 | HR 78

## 2015-12-31 DIAGNOSIS — Z9889 Other specified postprocedural states: Secondary | ICD-10-CM | POA: Insufficient documentation

## 2015-12-31 DIAGNOSIS — M545 Low back pain: Secondary | ICD-10-CM | POA: Insufficient documentation

## 2015-12-31 DIAGNOSIS — G8929 Other chronic pain: Secondary | ICD-10-CM | POA: Diagnosis not present

## 2015-12-31 DIAGNOSIS — Z87891 Personal history of nicotine dependence: Secondary | ICD-10-CM | POA: Insufficient documentation

## 2015-12-31 DIAGNOSIS — Z89511 Acquired absence of right leg below knee: Secondary | ICD-10-CM

## 2015-12-31 DIAGNOSIS — I739 Peripheral vascular disease, unspecified: Secondary | ICD-10-CM | POA: Diagnosis not present

## 2015-12-31 DIAGNOSIS — Z79899 Other long term (current) drug therapy: Secondary | ICD-10-CM

## 2015-12-31 DIAGNOSIS — G894 Chronic pain syndrome: Secondary | ICD-10-CM

## 2015-12-31 DIAGNOSIS — M961 Postlaminectomy syndrome, not elsewhere classified: Secondary | ICD-10-CM | POA: Diagnosis not present

## 2015-12-31 DIAGNOSIS — M47817 Spondylosis without myelopathy or radiculopathy, lumbosacral region: Secondary | ICD-10-CM | POA: Diagnosis not present

## 2015-12-31 DIAGNOSIS — Z8249 Family history of ischemic heart disease and other diseases of the circulatory system: Secondary | ICD-10-CM | POA: Insufficient documentation

## 2015-12-31 DIAGNOSIS — Z5181 Encounter for therapeutic drug level monitoring: Secondary | ICD-10-CM

## 2015-12-31 MED ORDER — ACETAMINOPHEN-CODEINE #4 300-60 MG PO TABS: 1.0000 | ORAL_TABLET | Freq: Four times a day (QID) | ORAL | 2 refills | Status: DC | PRN

## 2015-12-31 NOTE — Progress Notes (Signed)
Subjective:    Patient ID: Derek Sherman, male    DOB: 03/29/1960, 55 y.o.   MRN: [id number]  HPI: Derek Sherman is a 55 year old male who returns for follow up for chronic pain and medication refill. He says his pain is located in his lower back. He rates his pain 8. His current exercise regime is walking and performing stretching exercises.  Arrived hypertensive blood pressure re-checked.   Pain Inventory Average Pain 8 Pain Right Now 8 My pain is constant, sharp, stabbing and aching  In the last 24 hours, has pain interfered with the following? General activity 8 Relation with others 8 Enjoyment of life 8 What TIME of day is your pain at its worst? All Sleep (in general) Poor  Pain is worse with: walking, sitting and standing Pain improves with: rest and medication Relief from Meds: 4  Mobility Do you have any goals in this area?  no  Function disabled: date disabled 04/2004 Do you have any goals in this area?  no  Neuro/Psych No problems in this area  Prior Studies Any changes since last visit?  no  Physicians involved in your care Any changes since last visit?  no   Family History  Problem Relation Age of Onset  . Hypertension Mother   . Hypertension Father    Social History   Social History  . Marital status: Married    Spouse name: N/A  . Number of children: N/A  . Years of education: N/A   Social History Main Topics  . Smoking status: Former Smoker    Years: 28.00    Types: Cigarettes    Quit date: 11/19/2013  . Smokeless tobacco: Never Used     Comment: has been 1 week without smoking (using electronic)  . Alcohol use Not on file  . Drug use: Unknown  . Sexual activity: Not on file   Other Topics Concern  . Not on file   Social History Narrative  . No narrative on file   Past Surgical History:  Procedure Laterality Date  . CORONARY ARTERY BYPASS GRAFT    . SPINE SURGERY    . TONSILECTOMY, ADENOIDECTOMY, BILATERAL MYRINGOTOMY AND  TUBES     Past Medical History:  Diagnosis Date  . Depression   . Hyperlipidemia   . Hypertension    There were no vitals taken for this visit.  Opioid Risk Score:   Fall Risk Score:  `1  Depression screen PHQ 2/9  Depression screen [facility] 2/9 05/20/2015 07/08/2014  Decreased Interest 3 0  Down, Depressed, Hopeless 1 0  PHQ - 2 Score 4 0  Altered sleeping 3 2  Tired, decreased energy 1 0  Change in appetite 0 0  Feeling bad or failure about yourself  0 0  Trouble concentrating 0 0  Moving slowly or fidgety/restless 0 0  Suicidal thoughts 0 0  PHQ-9 Score 8 2  Difficult doing work/chores Somewhat difficult -    Review of Systems  All other systems reviewed and are negative.      Objective:   Physical Exam  Constitutional: He is oriented to person, place, and time. He appears well-developed and well-nourished.  HENT:  Head: Normocephalic and atraumatic.  Neck: Normal range of motion. Neck supple.  Cardiovascular: Normal rate and regular rhythm.   Pulmonary/Chest: Effort normal and breath sounds normal.  Musculoskeletal:  Normal Muscle Bulk and Muscle Testing Reveals: Upper Extremities: Full ROM and Muscle Strength 5/5 Lumbar Paraspinal Tenderness: L-4- L-5  Lower Extremities: Right BKA: wearing prosthesis Left: Full ROM and Muscle Strength 5/5 Arises from chair with ease Narrow Based gait  Neurological: He is alert and oriented to person, place, and time.  Skin: Skin is warm and dry.  Psychiatric: He has a normal mood and affect.  Nursing note and vitals reviewed.         Assessment & Plan:  1.Chronic Low back Pain: Refilled: Tylenol #4 300-60 mg  one tablet every 6 hours # 120.  We will continue the opioid monitoring program, this consists of regular clinic visits, examinations, urine drug screen,pill counts as well as use of [place] Controlled Substance Reporting System. Continue HEP.  2. PVD: S/P Right BKA/ Phantom Pain: Continue Gabapentin  15  minutes of face to face patient care time was spent during this visit. All questions were encouraged and answered.   F/U in 3 month

## 2016-01-02 ENCOUNTER — Encounter: Payer: Medicare Other | Admitting: Registered Nurse

## 2016-03-23 ENCOUNTER — Encounter: Payer: Medicare Other | Admitting: Registered Nurse

## 2016-03-26 ENCOUNTER — Encounter: Payer: Medicare Other | Attending: Registered Nurse | Admitting: Registered Nurse

## 2016-03-26 ENCOUNTER — Encounter: Payer: Self-pay | Admitting: Registered Nurse

## 2016-03-26 VITALS — BP 169/95 | HR 85

## 2016-03-26 DIAGNOSIS — Z89511 Acquired absence of right leg below knee: Secondary | ICD-10-CM | POA: Diagnosis not present

## 2016-03-26 DIAGNOSIS — Z8249 Family history of ischemic heart disease and other diseases of the circulatory system: Secondary | ICD-10-CM | POA: Diagnosis not present

## 2016-03-26 DIAGNOSIS — M47817 Spondylosis without myelopathy or radiculopathy, lumbosacral region: Secondary | ICD-10-CM

## 2016-03-26 DIAGNOSIS — Z79899 Other long term (current) drug therapy: Secondary | ICD-10-CM

## 2016-03-26 DIAGNOSIS — G8929 Other chronic pain: Secondary | ICD-10-CM | POA: Insufficient documentation

## 2016-03-26 DIAGNOSIS — Z5181 Encounter for therapeutic drug level monitoring: Secondary | ICD-10-CM

## 2016-03-26 DIAGNOSIS — M545 Low back pain: Secondary | ICD-10-CM | POA: Insufficient documentation

## 2016-03-26 DIAGNOSIS — Z9889 Other specified postprocedural states: Secondary | ICD-10-CM | POA: Diagnosis not present

## 2016-03-26 DIAGNOSIS — M961 Postlaminectomy syndrome, not elsewhere classified: Secondary | ICD-10-CM

## 2016-03-26 DIAGNOSIS — Z87891 Personal history of nicotine dependence: Secondary | ICD-10-CM | POA: Insufficient documentation

## 2016-03-26 DIAGNOSIS — I739 Peripheral vascular disease, unspecified: Secondary | ICD-10-CM | POA: Insufficient documentation

## 2016-03-26 DIAGNOSIS — G894 Chronic pain syndrome: Secondary | ICD-10-CM

## 2016-03-26 MED ORDER — ACETAMINOPHEN-CODEINE #4 300-60 MG PO TABS: 1.0000 | ORAL_TABLET | Freq: Four times a day (QID) | ORAL | 2 refills | Status: DC | PRN

## 2016-03-26 NOTE — Progress Notes (Signed)
Subjective:    Patient ID: Derek Sherman, male    DOB: May 19, 1960, 56 y.o.   MRN: [id number]  HPI: Derek Sherman is a 56 year old male who returns for follow up appointment for chronic pain and medication refill. He states his pain is located in his lower back.He rates his pain 9. His current exercise regime is walking and performing stretching exercises.    Pain Inventory Average Pain 9 Pain Right Now 9 My pain is stabbing  In the last 24 hours, has pain interfered with the following? General activity 9 Relation with others 8 Enjoyment of life 9 What TIME of day is your pain at its worst? . Sleep (in general) .  Pain is worse with: walking, bending, sitting and standing Pain improves with: . Relief from Meds: 2  Mobility walk without assistance ability to climb steps?  yes do you drive?  yes Do you have any goals in this area?  yes  Function disabled: date disabled 2006  Neuro/Psych No problems in this area  Prior Studies Any changes since last visit?  no  Physicians involved in your care Any changes since last visit?  no   Family History  Problem Relation Age of Onset  . Hypertension Mother   . Hypertension Father    Social History   Social History  . Marital status: Married    Spouse name: N/A  . Number of children: N/A  . Years of education: N/A   Social History Main Topics  . Smoking status: Former Smoker    Years: 28.00    Types: Cigarettes    Quit date: 11/19/2013  . Smokeless tobacco: Never Used     Comment: has been 1 week without smoking (using electronic)  . Alcohol use Not on file  . Drug use: Unknown  . Sexual activity: Not on file   Other Topics Concern  . Not on file   Social History Narrative  . No narrative on file   Past Surgical History:  Procedure Laterality Date  . CORONARY ARTERY BYPASS GRAFT    . SPINE SURGERY    . TONSILECTOMY, ADENOIDECTOMY, BILATERAL MYRINGOTOMY AND TUBES     Past Medical History:  Diagnosis  Date  . Depression   . Hyperlipidemia   . Hypertension    There were no vitals taken for this visit.  Opioid Risk Score:   Fall Risk Score:  `1  Depression screen PHQ 2/9  Depression screen [facility] 2/9 05/20/2015 07/08/2014  Decreased Interest 3 0  Down, Depressed, Hopeless 1 0  PHQ - 2 Score 4 0  Altered sleeping 3 2  Tired, decreased energy 1 0  Change in appetite 0 0  Feeling bad or failure about yourself  0 0  Trouble concentrating 0 0  Moving slowly or fidgety/restless 0 0  Suicidal thoughts 0 0  PHQ-9 Score 8 2  Difficult doing work/chores Somewhat difficult -   Review of Systems  Constitutional: Negative.   HENT: Negative.   Eyes: Negative.   Respiratory: Negative.   Cardiovascular: Negative.   Gastrointestinal: Negative.   Endocrine: Negative.   Genitourinary: Negative.   Musculoskeletal: Positive for back pain.  Skin: Negative.   Allergic/Immunologic: Negative.   Neurological: Negative.   Hematological: Negative.   Psychiatric/Behavioral: Negative.   All other systems reviewed and are negative.      Objective:   Physical Exam  Constitutional: He is oriented to person, place, and time. He appears well-developed and well-nourished.  HENT:  Head: Normocephalic and atraumatic.  Neck: Normal range of motion. Neck supple.  Cardiovascular: Normal rate and regular rhythm.   Pulmonary/Chest: Effort normal and breath sounds normal.  Musculoskeletal:  Normal Muscle Bulk and Muscle Testing Reveals: Upper Extremities: Full ROM and Muscle Strength 5/5 Back without spinal tenderness noted Lower Extremities: Right: BKA: With Prosthesis Intact Left: Full ROM and Muscle Strength 5/5 Arises from chair with ease Narrow Based Gait  Neurological: He is alert and oriented to person, place, and time.  Skin: Skin is warm and dry.  Psychiatric: He has a normal mood and affect.  Nursing note and vitals reviewed.         Assessment & Plan:  1.Chronic Low back Pain:  Refilled: Tylenol #4 300-60 mg one tablet every 6 hours# 120.  We will continue the opioid monitoring program, this consists of regular clinic visits, examinations, urine drug screen,pill counts as well as use of [place] Controlled Substance Reporting System. Continue HEP.  2. PVD: S/P Right BKA/ Phantom Pain: Continue Gabapentin  15 minutes of face to face patient care time was spent during this visit. All questions were encouraged and answered.   F/U in 3 month

## 2016-04-03 LAB — 6-ACETYLMORPHINE,TOXASSURE ADD
6-ACETYLMORPHINE: NEGATIVE
6-ACETYLMORPHINE: NOT DETECTED ng/mg{creat}

## 2016-04-03 LAB — TOXASSURE SELECT,+ANTIDEPR,UR

## 2016-04-06 NOTE — Progress Notes (Signed)
Urine drug screen for this encounter is consistent for prescribed medication 

## 2016-06-17 ENCOUNTER — Encounter: Payer: Medicare Other | Attending: Registered Nurse | Admitting: Registered Nurse

## 2016-06-17 DIAGNOSIS — G8929 Other chronic pain: Secondary | ICD-10-CM | POA: Insufficient documentation

## 2016-06-17 DIAGNOSIS — Z9889 Other specified postprocedural states: Secondary | ICD-10-CM | POA: Insufficient documentation

## 2016-06-17 DIAGNOSIS — Z8249 Family history of ischemic heart disease and other diseases of the circulatory system: Secondary | ICD-10-CM | POA: Insufficient documentation

## 2016-06-17 DIAGNOSIS — Z87891 Personal history of nicotine dependence: Secondary | ICD-10-CM | POA: Insufficient documentation

## 2016-06-17 DIAGNOSIS — M545 Low back pain: Secondary | ICD-10-CM | POA: Insufficient documentation

## 2016-06-17 DIAGNOSIS — I739 Peripheral vascular disease, unspecified: Secondary | ICD-10-CM | POA: Insufficient documentation

## 2016-06-18 ENCOUNTER — Ambulatory Visit: Payer: Medicare Other | Admitting: Registered Nurse

## 2016-07-20 ENCOUNTER — Encounter: Payer: Medicare Other | Admitting: Registered Nurse

## 2016-07-21 ENCOUNTER — Telehealth: Payer: Self-pay | Admitting: Registered Nurse

## 2016-07-21 NOTE — Telephone Encounter (Signed)
On 07/21/2016 the  NCCSR was reviewed no conflict was seen on the [facility] [place] ControlledSubstance Reporting System with multiple prescribers. [name] has a signed narcotic contract with our office. If there were any discrepancies this would have been reported to his physician.

## 2016-07-22 ENCOUNTER — Encounter: Payer: Medicare Other | Attending: Registered Nurse | Admitting: Registered Nurse

## 2016-07-22 ENCOUNTER — Encounter: Payer: Self-pay | Admitting: Registered Nurse

## 2016-07-22 VITALS — BP 170/104 | HR 79

## 2016-07-22 DIAGNOSIS — Z89511 Acquired absence of right leg below knee: Secondary | ICD-10-CM | POA: Diagnosis not present

## 2016-07-22 DIAGNOSIS — G8918 Other acute postprocedural pain: Secondary | ICD-10-CM | POA: Diagnosis not present

## 2016-07-22 DIAGNOSIS — Z8249 Family history of ischemic heart disease and other diseases of the circulatory system: Secondary | ICD-10-CM | POA: Diagnosis not present

## 2016-07-22 DIAGNOSIS — G546 Phantom limb syndrome with pain: Secondary | ICD-10-CM

## 2016-07-22 DIAGNOSIS — Z9889 Other specified postprocedural states: Secondary | ICD-10-CM | POA: Insufficient documentation

## 2016-07-22 DIAGNOSIS — I10 Essential (primary) hypertension: Secondary | ICD-10-CM

## 2016-07-22 DIAGNOSIS — T8789 Other complications of amputation stump: Secondary | ICD-10-CM

## 2016-07-22 DIAGNOSIS — Z87891 Personal history of nicotine dependence: Secondary | ICD-10-CM | POA: Insufficient documentation

## 2016-07-22 DIAGNOSIS — M545 Low back pain: Secondary | ICD-10-CM | POA: Diagnosis not present

## 2016-07-22 DIAGNOSIS — G894 Chronic pain syndrome: Secondary | ICD-10-CM | POA: Diagnosis not present

## 2016-07-22 DIAGNOSIS — M961 Postlaminectomy syndrome, not elsewhere classified: Secondary | ICD-10-CM | POA: Diagnosis not present

## 2016-07-22 DIAGNOSIS — Z79899 Other long term (current) drug therapy: Secondary | ICD-10-CM

## 2016-07-22 DIAGNOSIS — M47817 Spondylosis without myelopathy or radiculopathy, lumbosacral region: Secondary | ICD-10-CM

## 2016-07-22 DIAGNOSIS — Z5181 Encounter for therapeutic drug level monitoring: Secondary | ICD-10-CM

## 2016-07-22 DIAGNOSIS — I739 Peripheral vascular disease, unspecified: Secondary | ICD-10-CM | POA: Insufficient documentation

## 2016-07-22 DIAGNOSIS — G8929 Other chronic pain: Secondary | ICD-10-CM | POA: Diagnosis not present

## 2016-07-22 DIAGNOSIS — G548 Other nerve root and plexus disorders: Secondary | ICD-10-CM

## 2016-07-22 DIAGNOSIS — M79604 Pain in right leg: Secondary | ICD-10-CM

## 2016-07-22 MED ORDER — ACETAMINOPHEN-CODEINE #4 300-60 MG PO TABS: 1.0000 | ORAL_TABLET | Freq: Four times a day (QID) | ORAL | 2 refills | Status: DC | PRN

## 2016-07-22 NOTE — Progress Notes (Signed)
Subjective:    Patient ID: Derek Sherman, male    DOB: [birth year], 56 y.o.   MRN: [id number]  HPI: [name] is a 56 year old male who returns for follow up appointment for chronic pain and medication refill. He states his pain is located in his lower back and right Sherman.He rates his pain 9. His current exercise regime is walking and performing stretching exercises.  [name] has an opening area with eschar noted at the tibia. Also has a band aid intact at the Patella and right lower extremity laterally.  he has been going to [facility] for prosthesis adjustment, [name] has an appointment today with his PCP to address the right Sherman open area, no drainage or odor noted.  Also arrived hypertensive, blood pressure re-checked, he refused ED evaluation, states he will follow up with his PCP today,  Last UDS was on 03/26/2016, it was consistent.  Derek Sherman states he was in [place] on April 8th while walking his cane became lodged into a brick and he fell into a lamp post and landed on his buttock. His friend helped him up. He didn't seek medical attention.   Pain Inventory Average Pain 9 Pain Right Now 9 My pain is sharp, stabbing and aching  In the last 24 hours, has pain interfered with the following? General activity 8 Relation with others 8 Enjoyment of life 8 What TIME of day is your pain at its worst? daytime evening night Sleep (in general) Poor  Pain is worse with: walking, sitting and standing Pain improves with: rest and medication Relief from Meds: na  Mobility walk without assistance use a cane ability to climb steps?  yes do you drive?  yes  Function disabled: date disabled 6/06  Neuro/Psych No problems in this area  Prior Studies Any changes since last visit?  no  Physicians involved in your care Any changes since last visit?  no   Family History  Problem Relation Age of Onset  . Hypertension Mother   . Hypertension Father     Social History   Social History  . Marital status: Married    Spouse name: N/A  . Number of children: N/A  . Years of education: N/A   Social History Main Topics  . Smoking status: Former Smoker    Years: 28.00    Types: Cigarettes    Quit date: 11/19/2013  . Smokeless tobacco: Never Used     Comment: has been 1 week without smoking (using electronic)  . Alcohol use None  . Drug use: Unknown  . Sexual activity: Not Asked   Other Topics Concern  . None   Social History Narrative  . None   Past Surgical History:  Procedure Laterality Date  . CORONARY ARTERY BYPASS GRAFT    . SPINE SURGERY    . TONSILECTOMY, ADENOIDECTOMY, BILATERAL MYRINGOTOMY AND TUBES     Past Medical History:  Diagnosis Date  . Depression   . Hyperlipidemia   . Hypertension    BP (!) 158/100   Pulse 76   SpO2 98%   Opioid Risk Score:  0 Fall Risk Score:  `1  Depression screen PHQ 2/9  Depression screen [facility] 2/9 07/22/2016 05/20/2015 07/08/2014  Decreased Interest 0 3 0  Down, Depressed, Hopeless 0 1 0  PHQ - 2 Score 0 4 0  Altered sleeping - 3 2  Tired, decreased energy - 1 0  Change in appetite - 0 0  Feeling  bad or failure about yourself  - 0 0  Trouble concentrating - 0 0  Moving slowly or fidgety/restless - 0 0  Suicidal thoughts - 0 0  PHQ-9 Score - 8 2  Difficult doing work/chores - Somewhat difficult -    Review of Systems  Constitutional: Negative.   HENT: Negative.   Eyes: Negative.   Respiratory: Negative.   Cardiovascular: Negative.   Gastrointestinal: Negative.   Endocrine: Negative.   Genitourinary: Negative.   Musculoskeletal: Negative.   Skin: Negative.   Allergic/Immunologic: Negative.   Neurological: Negative.   Hematological: Negative.   Psychiatric/Behavioral: Negative.   All other systems reviewed and are negative.      Objective:   Physical Exam  Constitutional: He is oriented to person, place, and time. He appears well-developed and well-nourished.   HENT:  Head: Normocephalic and atraumatic.  Neck: Normal range of motion. Neck supple.  Cardiovascular: Normal rate and regular rhythm.   Pulmonary/Chest: Effort normal and breath sounds normal.  Musculoskeletal:  Normal Muscle Bulk and Muscle Testing Reveals: Upper Extremities: Full ROM and Muscle Strength 5/5 Lumbar Paraspinal Tenderness: L-3- L-5 Lower Extremities: Left: Full ROM and Muscle Strength 5/5 Right: Right: BKA Right Sherman with open area on Tibia Arises from chair slowly using straight cane for support Antalgic Gait     Neurological: He is alert and oriented to person, place, and time.  Skin: Skin is warm and dry.  Psychiatric: He has a normal mood and affect.  Nursing note and vitals reviewed.         Assessment & Plan:  1.Postlaminectomy/Chronic Low back Pain: Refilled: Tylenol #4 300-60 mg one tablet every 6 hours# 120.  We will continue the opioid monitoring program, this consists of regular clinic visits, examinations, urine drug screen,pill counts as well as use of [place] Controlled Substance Reporting System. Continue HEP. 2. PVD: S/P Right BKA/ Phantom Pain: Continue Gabapentin 3. Uncontrolled HTN: Refuses ED evaluation  20 minutes of face to face patient care time was spent during this visit. All questions were encouraged and answered.   F/U in 3 month

## 2016-07-29 LAB — TOXASSURE SELECT,+ANTIDEPR,UR

## 2016-07-30 ENCOUNTER — Telehealth: Payer: Self-pay | Admitting: *Deleted

## 2016-07-30 NOTE — Telephone Encounter (Signed)
Urine drug screen for this encounter is consistent for prescribed medication.  He had been our for several weeks and this was confirmed by NCCSR, so absent from urine is expected.

## 2016-10-18 ENCOUNTER — Ambulatory Visit: Payer: Medicare Other | Admitting: Physical Medicine & Rehabilitation

## 2016-10-29 ENCOUNTER — Encounter: Payer: Medicare Other | Attending: Registered Nurse

## 2016-10-29 ENCOUNTER — Encounter: Payer: Self-pay | Admitting: Physical Medicine & Rehabilitation

## 2016-10-29 ENCOUNTER — Ambulatory Visit (HOSPITAL_BASED_OUTPATIENT_CLINIC_OR_DEPARTMENT_OTHER): Payer: Medicare Other | Admitting: Physical Medicine & Rehabilitation

## 2016-10-29 VITALS — BP 102/70 | HR 98

## 2016-10-29 DIAGNOSIS — I739 Peripheral vascular disease, unspecified: Secondary | ICD-10-CM | POA: Diagnosis not present

## 2016-10-29 DIAGNOSIS — Z87891 Personal history of nicotine dependence: Secondary | ICD-10-CM | POA: Diagnosis not present

## 2016-10-29 DIAGNOSIS — G8929 Other chronic pain: Secondary | ICD-10-CM | POA: Insufficient documentation

## 2016-10-29 DIAGNOSIS — Z9889 Other specified postprocedural states: Secondary | ICD-10-CM | POA: Insufficient documentation

## 2016-10-29 DIAGNOSIS — Z89511 Acquired absence of right leg below knee: Secondary | ICD-10-CM

## 2016-10-29 DIAGNOSIS — M545 Low back pain, unspecified: Secondary | ICD-10-CM

## 2016-10-29 DIAGNOSIS — Z8249 Family history of ischemic heart disease and other diseases of the circulatory system: Secondary | ICD-10-CM | POA: Insufficient documentation

## 2016-10-29 MED ORDER — ACETAMINOPHEN-CODEINE #4 300-60 MG PO TABS: 1.0000 | ORAL_TABLET | Freq: Four times a day (QID) | ORAL | 2 refills | Status: DC | PRN

## 2016-10-29 NOTE — Patient Instructions (Signed)
PLease call to schedule Bilateral sacroiliac injection if back pain does not improve.

## 2016-10-29 NOTE — Progress Notes (Signed)
Subjective:    Patient ID: Derek Sherman, male    DOB: [birth year], 56 y.o.   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 He had a small <2cm round pre-tibial ulceration. On admission he stated that he had pain throughout his stump and this seemed out of porportion to his wound. It was discovered that his right iliac and femoral arterial system was thrombosed. On 5/15 he underwent extensive thrombectomy and endarterectomy of his EIA/CFA/ and profunda artery. This improved his blood flow and he had relief of pain. His wound was debrided in the same OR visit and a wound vac was placed. He underwent a repeat washout and the wound was closed with skin staples and nylon sutures. These will remain in place for 6 weeks. He was on eliquis prior to coming in but requested to be on coumadin. He was bridged with heparin and will be discharged on 5mg  of coumadin. He has a follow up scheduled with his PCP this week for an INR check and post hospital visit. He has been instructed to not wear his stump shrinker or prosthetic limb until his staples are removed in 6 weeks.      HPI  Had revision on his stump the end of July after being hospitalized month of June for infection Some exacerbation of low back since using walker Unable to use prosthesis due to wound healing issues Sees vascular surgery next week for suture removal  Takes gabapentin 400mg  2 tab in am, 2-3 tabs qpm, and 2 tabs qhs Pain Inventory Average Pain 8 Pain Right Now 9 My pain is sharp, stabbing and aching  In the last 24 hours, has pain interfered with the following? General activity 8 Relation with others 8 Enjoyment of life 8 What TIME of day is your pain at its worst? all Sleep (in general) NA  Pain is worse with: walking, standing and some activites Pain improves with: rest and medication Relief from Meds: 0  Mobility walk without assistance ability to climb steps?  yes do you drive?   yes  Function disabled: date disabled 08/2004  Neuro/Psych No problems in this area  Prior Studies Any changes since last visit?  no  Physicians involved in your care Any changes since last visit?  no   Family History  Problem Relation Age of Onset  . Hypertension Mother   . Hypertension Father    Social History   Social History  . Marital status: Married    Spouse name: N/A  . Number of children: N/A  . Years of education: N/A   Social History Main Topics  . Smoking status: Former Smoker    Years: 28.00    Types: Cigarettes    Quit date: 11/19/2013  . Smokeless tobacco: Never Used     Comment: has been 1 week without smoking (using electronic)  . Alcohol use None  . Drug use: Unknown  . Sexual activity: Not Asked   Other Topics Concern  . None   Social History Narrative  . None   Past Surgical History:  Procedure Laterality Date  . CORONARY ARTERY BYPASS GRAFT    . SPINE SURGERY    . TONSILECTOMY, ADENOIDECTOMY, BILATERAL MYRINGOTOMY AND TUBES     Past Medical History:  Diagnosis Date  . Depression   . Hyperlipidemia   . Hypertension    BP 102/70   Pulse 98   SpO2 92%   Opioid Risk Score:  0 Fall Risk Score:  `  1  Depression screen PHQ 2/9  Depression screen [facility] 2/9 10/29/2016 07/22/2016 05/20/2015 07/08/2014  Decreased Interest 2 0 3 0  Down, Depressed, Hopeless 2 0 1 0  PHQ - 2 Score 4 0 4 0  Altered sleeping - - 3 2  Tired, decreased energy - - 1 0  Change in appetite - - 0 0  Feeling bad or failure about yourself  - - 0 0  Trouble concentrating - - 0 0  Moving slowly or fidgety/restless - - 0 0  Suicidal thoughts - - 0 0  PHQ-9 Score - - 8 2  Difficult doing work/chores - - Somewhat difficult -    Review of Systems  Constitutional: Negative.   HENT: Negative.   Eyes: Negative.   Respiratory: Negative.   Cardiovascular: Negative.   Gastrointestinal: Negative.   Endocrine: Negative.   Genitourinary: Negative.   Musculoskeletal:  Negative.   Skin: Positive for wound.  Allergic/Immunologic: Negative.   Neurological: Negative.   Hematological: Bruises/bleeds easily.  Psychiatric/Behavioral: Positive for dysphoric mood.  All other systems reviewed and are negative.      Objective:   Physical Exam  Constitutional: He is oriented to person, place, and time. He appears well-developed and well-nourished. No distress.  HENT:  Head: Normocephalic and atraumatic.  Eyes: Pupils are equal, round, and reactive to light. Conjunctivae and EOM are normal.  Neck: Normal range of motion.  Neurological: He is alert and oriented to person, place, and time.  Skin: He is not diaphoretic.  Psychiatric: He has a normal mood and affect. His behavior is normal.  Nursing note and vitals reviewed.  Right BKA stump with retention sutures, September this granulation tissue along the incision line. Right lower extremity with edema.  Limited spine ROM with ext, normal flex, reduced lat bending  Mild tenderness over the right PSIS Negative straight-leg raise     Assessment & Plan:  1. History lumbar spondylosis, chronic low back pain. Also has sacroiliac dysfunction. He's had some exacerbation of this pain related to his right BKA stump wound. No longer using his prosthetic and using a walker. We discussed management of his exacerbation. He will follow-up with his vascular surgeon next week Given his swelling and open area of the wound. I'm not confident that he'll be advanced to using the prosthetic at this point.  He may be a good candidate for sacroiliac injections under fluoroscopic guidance to help manage his current exacerbation. He will call us to schedule. If his pain persists  Continue Tylenol with Codeine No. 4 one tablet 4 times per day Follow-up with nurse practitioner, 3 months Buccal swab for drug screen  2. Phantom limb pain. Continue gabapentin as prescribed by primary care physician

## 2016-11-06 LAB — DRUG TOX MONITOR 1 W/CONF, ORAL FLD
AMPHETAMINES: NEGATIVE ng/mL (ref <10)
BARBITURATES: NEGATIVE ng/mL (ref <10)
Benzodiazepines: NEGATIVE ng/mL (ref <0.50)
Buprenorphine: NEGATIVE ng/mL (ref <0.025)
COCAINE: NEGATIVE ng/mL (ref <2.5)
Dihydrocodeine: NEGATIVE ng/mL (ref <2.5)
Fentanyl: NEGATIVE ng/mL (ref <0.10)
Heroin Metabolite: NEGATIVE ng/mL (ref <1.0)
Hydrocodone: 32.3 ng/mL — ABNORMAL HIGH (ref <2.5)
Hydromorphone: NEGATIVE ng/mL (ref <2.5)
MDMA: NEGATIVE ng/mL (ref <10)
MEPERIDINE: NEGATIVE ng/mL (ref <5.0)
Marijuana: NEGATIVE ng/mL (ref <2.5)
Meprobamate: NEGATIVE ng/mL (ref <2.5)
Methadone: NEGATIVE ng/mL (ref <5.0)
Morphine: 9.5 ng/mL — ABNORMAL HIGH (ref <2.5)
NICOTINE METABOLITE: POSITIVE ng/mL — AB (ref <5.0)
NOROXYCODONE: NEGATIVE ng/mL (ref <2.5)
Norhydrocodone: NEGATIVE ng/mL (ref <2.5)
OXYCODONE: NEGATIVE ng/mL (ref <2.5)
OXYMORPHONE: NEGATIVE ng/mL (ref <2.5)
Opiates: POSITIVE ng/mL — AB (ref <2.5)
Phencyclidine: NEGATIVE ng/mL (ref <10)
Propoxyphene: NEGATIVE ng/mL (ref <5.0)
Tapentadol: NEGATIVE ng/mL (ref <5.0)
Tramadol: NEGATIVE ng/mL (ref <5.0)
Zolpidem: NEGATIVE ng/mL (ref <5.0)

## 2016-11-06 LAB — DRUG TOX ALC METAB W/CON, ORAL FLD: Alcohol Metabolite: NEGATIVE ng/mL (ref <25)

## 2016-11-11 ENCOUNTER — Telehealth: Payer: Self-pay | Admitting: *Deleted

## 2016-11-11 NOTE — Telephone Encounter (Signed)
Oral swab drug screen was consistent for prescribed medications.  ?

## 2017-01-20 ENCOUNTER — Other Ambulatory Visit: Payer: Self-pay

## 2017-01-20 NOTE — Telephone Encounter (Signed)
Patient called requesting refill on medication. He stated that he is going to run out before his next appointment and has 3 days left.

## 2017-01-21 MED ORDER — ACETAMINOPHEN-CODEINE #4 300-60 MG PO TABS: 1.0000 | ORAL_TABLET | Freq: Four times a day (QID) | ORAL | 0 refills | Status: DC | PRN

## 2017-01-21 NOTE — Telephone Encounter (Signed)
Medication ordered, patient notified and reminded patient to keep his 01/28/2017 appointment

## 2017-01-28 ENCOUNTER — Encounter: Payer: Self-pay | Admitting: Registered Nurse

## 2017-01-28 ENCOUNTER — Encounter: Payer: Medicare Other | Attending: Registered Nurse | Admitting: Registered Nurse

## 2017-01-28 ENCOUNTER — Other Ambulatory Visit: Payer: Self-pay

## 2017-01-28 ENCOUNTER — Telehealth: Payer: Self-pay | Admitting: Registered Nurse

## 2017-01-28 VITALS — BP 133/70 | HR 64 | Resp 14

## 2017-01-28 DIAGNOSIS — G894 Chronic pain syndrome: Secondary | ICD-10-CM

## 2017-01-28 DIAGNOSIS — Z5181 Encounter for therapeutic drug level monitoring: Secondary | ICD-10-CM | POA: Diagnosis not present

## 2017-01-28 DIAGNOSIS — M961 Postlaminectomy syndrome, not elsewhere classified: Secondary | ICD-10-CM

## 2017-01-28 DIAGNOSIS — Z9889 Other specified postprocedural states: Secondary | ICD-10-CM | POA: Diagnosis not present

## 2017-01-28 DIAGNOSIS — Z87891 Personal history of nicotine dependence: Secondary | ICD-10-CM | POA: Diagnosis not present

## 2017-01-28 DIAGNOSIS — Z89511 Acquired absence of right leg below knee: Secondary | ICD-10-CM

## 2017-01-28 DIAGNOSIS — M47817 Spondylosis without myelopathy or radiculopathy, lumbosacral region: Secondary | ICD-10-CM | POA: Diagnosis not present

## 2017-01-28 DIAGNOSIS — I739 Peripheral vascular disease, unspecified: Secondary | ICD-10-CM | POA: Diagnosis not present

## 2017-01-28 DIAGNOSIS — Z8249 Family history of ischemic heart disease and other diseases of the circulatory system: Secondary | ICD-10-CM | POA: Insufficient documentation

## 2017-01-28 DIAGNOSIS — Z79899 Other long term (current) drug therapy: Secondary | ICD-10-CM

## 2017-01-28 DIAGNOSIS — M545 Low back pain: Secondary | ICD-10-CM | POA: Insufficient documentation

## 2017-01-28 DIAGNOSIS — G8929 Other chronic pain: Secondary | ICD-10-CM | POA: Insufficient documentation

## 2017-01-28 DIAGNOSIS — G546 Phantom limb syndrome with pain: Secondary | ICD-10-CM

## 2017-01-28 MED ORDER — ACETAMINOPHEN-CODEINE #4 300-60 MG PO TABS: 1.0000 | ORAL_TABLET | Freq: Four times a day (QID) | ORAL | 2 refills | Status: DC | PRN

## 2017-01-28 NOTE — Telephone Encounter (Signed)
On 01/28/2017 the  [facility] was reviewed no conflict was seen on the [facility] [place] with multiple prescribers. [name] has a signed narcotic contract with our office. If there were any discrepancies this would have been reported to his physician.

## 2017-01-28 NOTE — Progress Notes (Deleted)
Subjective:    Patient ID: Derek Sherman, male    DOB: 10/01/1960, 56 y.o.   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 He had a small <2cm round pre-tibial ulceration. On admission he stated that he had pain throughout his stump and this seemed out of porportion to his wound. It was discovered that his right iliac and femoral arterial system was thrombosed. On 5/15 he underwent extensive thrombectomy and endarterectomy of his EIA/CFA/ and profunda artery. This improved his blood flow and he had relief of pain. His wound was debrided in the same OR visit and a wound vac was placed. He underwent a repeat washout and the wound was closed with skin staples and nylon sutures. These will remain in place for 6 weeks. He was on eliquis prior to coming in but requested to be on coumadin. He was bridged with heparin and will be discharged on 5mg  of coumadin. He has a follow up scheduled with his PCP this week for an INR check and post hospital visit. He has been instructed to not wear his stump shrinker or prosthetic limb until his staples are removed in 6 weeks.      HPI  Had revision on his stump the end of July after being hospitalized month of June for infection Some exacerbation of low back since using walker Unable to use prosthesis due to wound healing issues Sees vascular surgery next week for suture removal  Takes gabapentin 400mg  2 tab in am, 2-3 tabs qpm, and 2 tabs qhs Pain Inventory Average Pain 9 Pain Right Now 8 My pain is sharp, stabbing and aching  In the last 24 hours, has pain interfered with the following? General activity 9 Relation with others 8 Enjoyment of life 8 What TIME of day is your pain at its worst? all Sleep (in general) Poor  Pain is worse with: walking and standing Pain improves with: rest and medication Relief from Meds: 5  Mobility walk with assistance use a walker ability to climb steps?  no do you drive?   yes  Function disabled: date disabled 08/2004  Neuro/Psych trouble walking depression  Prior Studies Any changes since last visit?  no  Physicians involved in your care Any changes since last visit?  no   Family History  Problem Relation Age of Onset  . Hypertension Mother   . Hypertension Father    Social History   Socioeconomic History  . Marital status: Married    Spouse name: None  . Number of children: None  . Years of education: None  . Highest education level: None  Social Needs  . Financial resource strain: None  . Food insecurity - worry: None  . Food insecurity - inability: None  . Transportation needs - medical: None  . Transportation needs - non-medical: None  Occupational History  . None  Tobacco Use  . Smoking status: Former Smoker    Years: 28.00    Types: Cigarettes    Last attempt to quit: 11/19/2013    Years since quitting: 3.1  . Smokeless tobacco: Never Used  . Tobacco comment: has been 1 week without smoking (using electronic)  Substance and Sexual Activity  . Alcohol use: None  . Drug use: None  . Sexual activity: None  Other Topics Concern  . None  Social History Narrative  . None   Past Surgical History:  Procedure Laterality Date  . CORONARY ARTERY BYPASS GRAFT    . SPINE  SURGERY    . TONSILECTOMY, ADENOIDECTOMY, BILATERAL MYRINGOTOMY AND TUBES     Past Medical History:  Diagnosis Date  . Depression   . Hyperlipidemia   . Hypertension    BP 133/70   Pulse 64   Resp 14   SpO2 97%   Opioid Risk Score:  0 Fall Risk Score:  `1  Depression screen PHQ 2/9  [facility] 10/29/2016 07/22/2016 05/20/2015 07/08/2014  Decreased Interest 2 0 3 0  Down, Depressed, Hopeless 2 0 1 0  PHQ - 2 Score 4 0 4 0  Altered sleeping - - 3 2  Tired, decreased energy - - 1 0  Change in appetite - - 0 0  Feeling bad or failure about yourself  - - 0 0  Trouble concentrating - - 0 0  Moving slowly or fidgety/restless - - 0 0   Suicidal thoughts - - 0 0  PHQ-9 Score - - 8 2  Difficult doing work/chores - - Somewhat difficult -    Review of Systems  Constitutional: Negative.   HENT: Negative.   Eyes: Negative.   Respiratory: Negative.   Cardiovascular: Negative.   Gastrointestinal: Negative.   Endocrine: Negative.   Genitourinary: Negative.   Musculoskeletal: Positive for gait problem.  Skin: Negative.   Allergic/Immunologic: Negative.   Hematological: Bruises/bleeds easily.  Psychiatric/Behavioral: Positive for dysphoric mood.  All other systems reviewed and are negative.      Objective:   Physical Exam  Constitutional: He is oriented to person, place, and time. He appears well-developed and well-nourished. No distress.  HENT:  Head: Normocephalic and atraumatic.  Eyes: Conjunctivae and EOM are normal. Pupils are equal, round, and reactive to light.  Neck: Normal range of motion.  Neurological: He is alert and oriented to person, place, and time.  Skin: He is not diaphoretic.  Psychiatric: He has a normal mood and affect. His behavior is normal.  Nursing note and vitals reviewed.  Right BKA stump with retention sutures, September this granulation tissue along the incision line. Right lower extremity with edema.  Limited spine ROM with ext, normal flex, reduced lat bending  Mild tenderness over the right PSIS Negative straight-leg raise     Assessment & Plan:  1. History lumbar spondylosis, chronic low back pain. Also has sacroiliac dysfunction. He's had some exacerbation of this pain related to his right BKA stump wound. No longer using his prosthetic and using a walker. We discussed management of his exacerbation. He will follow-up with his vascular surgeon next week Given his swelling and open area of the wound. I'm not confident that he'll be advanced to using the prosthetic at this point.  He may be a good candidate for sacroiliac injections under fluoroscopic guidance to help manage  his current exacerbation. He will call us to schedule. If his pain persists  Continue Tylenol with Codeine No. 4 one tablet 4 times per day Follow-up with nurse practitioner, 3 months Buccal swab for drug screen  2. Phantom limb pain. Continue gabapentin as prescribed by primary care physician

## 2017-01-28 NOTE — Progress Notes (Signed)
Subjective:    Patient ID: Derek Sherman, male    DOB: 11/12/1960, 56 y.o.   MRN: [id number]  HPI: [name] is a 56 year old male who returns for follow up appointment for chronic pain and medication refill. He states his pain is located in his lower back and right stump.He rates his pain 8. His current exercise regime is walking with walker.   [name] Morphine equivalent is 30.00 MME.    [name] underwent Right AKA on 11/16/2016 by Dr. Christell ConstantMoore, site clean and dry.   Last UDS was on 10/29/2016, it was consistent.    Pain Inventory Average Pain 9 Pain Right Now 8 My pain is sharp, stabbing and aching  In the last 24 hours, has pain interfered with the following? General activity 9 Relation with others 8 Enjoyment of life 8 What TIME of day is your pain at its worst? all Sleep (in general) Poor  Pain is worse with: walking and standing Pain improves with: rest and medication Relief from Meds: 5  Mobility walk with assistance use a walker ability to climb steps?  no do you drive?  yes  Function disabled: date disabled 6/06  Neuro/Psych trouble walking depression  Prior Studies Any changes since last visit?  no  Physicians involved in your care Any changes since last visit?  no   Family History  Problem Relation Age of Onset  . Hypertension Mother   . Hypertension Father    Social History   Socioeconomic History  . Marital status: Married    Spouse name: None  . Number of children: None  . Years of education: None  . Highest education level: None  Social Needs  . Financial resource strain: None  . Food insecurity - worry: None  . Food insecurity - inability: None  . Transportation needs - medical: None  . Transportation needs - non-medical: None  Occupational History  . None  Tobacco Use  . Smoking status: Former Smoker    Years: 28.00    Types: Cigarettes    Last attempt to quit: 11/19/2013    Years since quitting: 3.1  . Smokeless  tobacco: Never Used  . Tobacco comment: has been 1 week without smoking (using electronic)  Substance and Sexual Activity  . Alcohol use: None  . Drug use: None  . Sexual activity: None  Other Topics Concern  . None  Social History Narrative  . None   Past Surgical History:  Procedure Laterality Date  . CORONARY ARTERY BYPASS GRAFT    . SPINE SURGERY    . TONSILECTOMY, ADENOIDECTOMY, BILATERAL MYRINGOTOMY AND TUBES     Past Medical History:  Diagnosis Date  . Depression   . Hyperlipidemia   . Hypertension    BP 133/70   Pulse 64   Resp 14   SpO2 97%   Opioid Risk Score:  0 Fall Risk Score:  `1  Depression screen PHQ 2/9  [facility] 10/29/2016 07/22/2016 05/20/2015 07/08/2014  Decreased Interest 2 0 3 0  Down, Depressed, Hopeless 2 0 1 0  PHQ - 2 Score 4 0 4 0  Altered sleeping - - 3 2  Tired, decreased energy - - 1 0  Change in appetite - - 0 0  Feeling bad or failure about yourself  - - 0 0  Trouble concentrating - - 0 0  Moving slowly or fidgety/restless - - 0 0  Suicidal thoughts - - 0 0  PHQ-9 Score - -  8 2  Difficult doing work/chores - - Somewhat difficult -    Review of Systems  Constitutional: Negative.   HENT: Negative.   Eyes: Negative.   Respiratory: Negative.   Cardiovascular: Negative.   Gastrointestinal: Negative.   Endocrine: Negative.   Genitourinary: Negative.   Musculoskeletal: Positive for gait problem.  Skin: Negative.   Allergic/Immunologic: Negative.   Hematological: Negative.   Psychiatric/Behavioral: Positive for dysphoric mood.  All other systems reviewed and are negative.      Objective:   Physical Exam  Constitutional: He is oriented to person, place, and time. He appears well-developed and well-nourished.  HENT:  Head: Normocephalic and atraumatic.  Neck: Normal range of motion. Neck supple.  Cardiovascular: Normal rate and regular rhythm.  Pulmonary/Chest: Effort normal and breath sounds normal.    Musculoskeletal:  Normal Muscle Bulk and Muscle Testing Reveals: Upper Extremities: Full ROM and Muscle Strength 5/5 Back without spinal Tenderness Lower Extremities: Left: Full ROM and Muscle Strength 5/5 Right: Right: AKA Right Stump Clean and dry. / wearing shrinker Arises from chair slowly using walker for support Narrow Based Gait     Neurological: He is alert and oriented to person, place, and time.  Skin: Skin is warm and dry.  Psychiatric: He has a normal mood and affect.  Nursing note and vitals reviewed.         Assessment & Plan:  1.Postlaminectomy/Chronic Low back Pain: Refilled: Tylenol #4 300-60 mg one tablet every 6 hours# 120. 01/28/2017 We will continue the opioid monitoring program, this consists of regular clinic visits, examinations, urine drug screen,pill counts as well as use of [place] Cont[place] Sub[place] Reporting System. Continue HEP. 2. PVD: S/P Right AKA/ Phantom Pain: Continue Gabapentin. 01/28/2017  20 minutes of face to face patient care time was spent during this visit. All questions were encouraged and answered.   F/U in 3 month

## 2017-05-02 ENCOUNTER — Encounter: Payer: Medicare Other | Admitting: Registered Nurse

## 2017-05-30 ENCOUNTER — Ambulatory Visit: Payer: Medicare Other | Admitting: Physical Medicine & Rehabilitation

## 2017-05-31 ENCOUNTER — Telehealth: Payer: Self-pay | Admitting: *Deleted

## 2017-05-31 ENCOUNTER — Encounter: Payer: Self-pay | Admitting: Physical Medicine & Rehabilitation

## 2017-05-31 ENCOUNTER — Ambulatory Visit (HOSPITAL_BASED_OUTPATIENT_CLINIC_OR_DEPARTMENT_OTHER): Payer: Medicare Other | Admitting: Physical Medicine & Rehabilitation

## 2017-05-31 ENCOUNTER — Encounter: Payer: Medicare Other | Attending: Physical Medicine & Rehabilitation

## 2017-05-31 VITALS — BP 172/102 | HR 85

## 2017-05-31 DIAGNOSIS — M47817 Spondylosis without myelopathy or radiculopathy, lumbosacral region: Secondary | ICD-10-CM

## 2017-05-31 DIAGNOSIS — G546 Phantom limb syndrome with pain: Secondary | ICD-10-CM

## 2017-05-31 DIAGNOSIS — M961 Postlaminectomy syndrome, not elsewhere classified: Secondary | ICD-10-CM | POA: Diagnosis not present

## 2017-05-31 DIAGNOSIS — M545 Low back pain, unspecified: Secondary | ICD-10-CM

## 2017-05-31 DIAGNOSIS — M791 Myalgia, unspecified site: Secondary | ICD-10-CM | POA: Diagnosis not present

## 2017-05-31 DIAGNOSIS — Z79899 Other long term (current) drug therapy: Secondary | ICD-10-CM | POA: Insufficient documentation

## 2017-05-31 DIAGNOSIS — M2550 Pain in unspecified joint: Secondary | ICD-10-CM | POA: Insufficient documentation

## 2017-05-31 DIAGNOSIS — I10 Essential (primary) hypertension: Secondary | ICD-10-CM | POA: Diagnosis not present

## 2017-05-31 DIAGNOSIS — E785 Hyperlipidemia, unspecified: Secondary | ICD-10-CM | POA: Insufficient documentation

## 2017-05-31 DIAGNOSIS — R269 Unspecified abnormalities of gait and mobility: Secondary | ICD-10-CM | POA: Diagnosis not present

## 2017-05-31 DIAGNOSIS — Z89611 Acquired absence of right leg above knee: Secondary | ICD-10-CM | POA: Diagnosis not present

## 2017-05-31 DIAGNOSIS — F329 Major depressive disorder, single episode, unspecified: Secondary | ICD-10-CM | POA: Diagnosis not present

## 2017-05-31 DIAGNOSIS — G8929 Other chronic pain: Secondary | ICD-10-CM

## 2017-05-31 DIAGNOSIS — Z89619 Acquired absence of unspecified leg above knee: Secondary | ICD-10-CM | POA: Insufficient documentation

## 2017-05-31 MED ORDER — ACETAMINOPHEN-CODEINE #4 300-60 MG PO TABS: 1.0000 | ORAL_TABLET | Freq: Four times a day (QID) | ORAL | 2 refills | Status: DC | PRN

## 2017-05-31 NOTE — Telephone Encounter (Signed)
Mr Derek Sherman called Friday about needing refill on his Tylenol #4 stating he would be out Saturday.  Unfortunately due to phone issues, we did not get his message until Monday (late). I called Mr Derek Sherman and he decided since he has the appt today with Dr Wynn BankerKirsteins, he will just wait.  He would like the medication sent in to the pharmacy while he is at his appt so it will be ready when he gets to the pharmacy in [place] (he has moved). Noted.

## 2017-05-31 NOTE — Progress Notes (Signed)
Subjective:    Patient ID: Derek Sherman, male    DOB: 05/23/1960, 57 y.o.   MRN: [id number]  HPI Pt now has R AKA no post op issues Shrinking , needs new socket Phantom limb pain relieved partially by Gabapentin Pain Inventory Average Pain 8 Pain Right Now 10 My pain is sharp, stabbing and aching  In the last 24 hours, has pain interfered with the following? General activity 9 Relation with others 8 Enjoyment of life 9 What TIME of day is your pain at its worst? all Sleep (in general) Poor  Pain is worse with: walking and standing Pain improves with: rest and medication Relief from Meds: .  Mobility use a cane ability to climb steps?  yes do you drive?  yes  Function Do you have any goals in this area?  no  Neuro/Psych No problems in this area  Prior Studies Any changes since last visit?  no  Physicians involved in your care Any changes since last visit?  no   Family History  Problem Relation Age of Onset  . Hypertension Mother   . Hypertension Father    Social History   Socioeconomic History  . Marital status: Married    Spouse name: Not on file  . Number of children: Not on file  . Years of education: Not on file  . Highest education level: Not on file  Social Needs  . Financial resource strain: Not on file  . Food insecurity - worry: Not on file  . Food insecurity - inability: Not on file  . Transportation needs - medical: Not on file  . Transportation needs - non-medical: Not on file  Occupational History  . Not on file  Tobacco Use  . Smoking status: Former Smoker    Years: 28.00    Types: Cigarettes    Last attempt to quit: 11/19/2013    Years since quitting: 3.5  . Smokeless tobacco: Never Used  . Tobacco comment: has been 1 week without smoking (using electronic)  Substance and Sexual Activity  . Alcohol use: Not on file  . Drug use: Not on file  . Sexual activity: Not on file  Other Topics Concern  . Not on file  Social History  Narrative  . Not on file   Past Surgical History:  Procedure Laterality Date  . CORONARY ARTERY BYPASS GRAFT    . SPINE SURGERY    . TONSILECTOMY, ADENOIDECTOMY, BILATERAL MYRINGOTOMY AND TUBES     Past Medical History:  Diagnosis Date  . Depression   . Hyperlipidemia   . Hypertension    There were no vitals taken for this visit.  Opioid Risk Score:   Fall Risk Score:  `1  Depression screen PHQ 2/9  [facility] 10/29[facility]2016 07/22/2016 05/20/2015 07/08/2014  Decreased Interest 2 0 3 0  Down, Depressed, Hopeless 2 0 1 0  PHQ - 2 Score 4 0 4 0  Altered sleeping - - 3 2  Tired, decreased energy - - 1 0  Change in appetite - - 0 0  Feeling bad or failure about yourself  - - 0 0  Trouble concentrating - - 0 0  Moving slowly or fidgety/restless - - 0 0  Suicidal thoughts - - 0 0  PHQ-9 Score - - 8 2  Difficult doing work/chores - - Somewhat difficult -     Review of Systems  Constitutional: Negative.   HENT: Negative.   Eyes: Negative.   Respiratory: Negative.  Cardiovascular: Negative.   Gastrointestinal: Negative.   Endocrine: Negative.   Genitourinary: Negative.   Musculoskeletal: Positive for arthralgias, back pain, gait problem and myalgias.  Skin: Negative.   Allergic/Immunologic: Negative.   Hematological: Negative.   Psychiatric/Behavioral: Negative.   All other systems reviewed and are negative.      Objective:   Physical Exam        Assessment & Plan:

## 2017-05-31 NOTE — Addendum Note (Signed)
Addended by: Erick ColaceKIRSTEINS, ANDREW E on: 05/31/2017 11:47 AM   Modules accepted: Orders

## 2017-07-28 ENCOUNTER — Telehealth: Payer: Self-pay

## 2017-07-28 NOTE — Telephone Encounter (Signed)
Pt called stating he was suppose to be referred to another pain clinic in [place]. I called pt and gave him the information about the referral.

## 2017-08-12 ENCOUNTER — Telehealth: Payer: Self-pay | Admitting: Physical Medicine & Rehabilitation

## 2017-08-12 NOTE — Telephone Encounter (Signed)
Pt has appt with [facility]( Dr Darliss Ridgel on 09/02/17. Meds will run out 6/15 & would like enough meds to get thru until appt with [facility].  [facility] in [place], [place]  Thanks,  Rosezella [place].

## 2017-08-25 MED ORDER — ACETAMINOPHEN-CODEINE #4 300-60 MG PO TABS: 1.0000 | ORAL_TABLET | Freq: Four times a day (QID) | ORAL | 0 refills | Status: AC | PRN

## 2017-08-25 NOTE — Telephone Encounter (Signed)
Patient called again asking if Dr. Wynn BankerKirsteins or Jacalyn LefevreEunice Thomas, ANP would help by covering his pain medication needs until he can be [place] [facility] [place].  His appointment in June 22nd. His meds will run out 08/27/2017.  Side note:  He will probably need additional coverage for the time it takes for his  UDS to clear as well.

## 2017-08-25 NOTE — Addendum Note (Signed)
Addended by: Angela NevinWESSLING, Starr Engel D on: 08/25/2017 04:06 PM   Modules accepted: Orders

## 2017-08-25 NOTE — Telephone Encounter (Signed)
Call in Tylenol No. 4 with codeine # 60 1 p.o. 4 times daily, 2 weeks supply no refill

## 2017-08-25 NOTE — Telephone Encounter (Signed)
Medication ordered, patient notified 

## 2019-02-13 DEATH — deceased
